# Patient Record
Sex: Female | Born: 1990 | Race: White | Hispanic: No | Marital: Single | Smoking: Never smoker
Health system: Southern US, Community
[De-identification: ages and names within clinical notes are randomized; demographics above are authoritative.]

## PROBLEM LIST (undated history)

## (undated) ENCOUNTER — Inpatient Hospital Stay (HOSPITAL_COMMUNITY): Payer: Self-pay

## (undated) DIAGNOSIS — D649 Anemia, unspecified: Secondary | ICD-10-CM

## (undated) DIAGNOSIS — L0591 Pilonidal cyst without abscess: Secondary | ICD-10-CM

## (undated) DIAGNOSIS — Z5189 Encounter for other specified aftercare: Secondary | ICD-10-CM

## (undated) HISTORY — PX: CYST REMOVAL HAND: SHX6279

## (undated) HISTORY — PX: VAGINAL WOUND CLOSURE / REPAIR: SUR258

## (undated) HISTORY — PX: PILONIDAL CYST EXCISION: SHX744

---
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
## 2016-04-13 ENCOUNTER — Encounter: Payer: Self-pay | Admitting: *Deleted

## 2016-04-13 ENCOUNTER — Ambulatory Visit (INDEPENDENT_AMBULATORY_CARE_PROVIDER_SITE_OTHER): Payer: PRIVATE HEALTH INSURANCE

## 2016-04-13 DIAGNOSIS — Z3201 Encounter for pregnancy test, result positive: Secondary | ICD-10-CM | POA: Diagnosis not present

## 2016-04-13 DIAGNOSIS — Z3200 Encounter for pregnancy test, result unknown: Secondary | ICD-10-CM

## 2016-04-13 LAB — POCT URINE PREGNANCY: Preg Test, Ur: POSITIVE — AB

## 2016-04-13 NOTE — Progress Notes (Signed)
Patient presents for Pregnancy Test, UPT-POSITIVE. LMP 02/29/2016. Patient advised to make an appointment for Prenatal Care.

## 2016-05-10 ENCOUNTER — Encounter (HOSPITAL_COMMUNITY): Payer: Self-pay

## 2016-05-10 ENCOUNTER — Inpatient Hospital Stay (HOSPITAL_COMMUNITY)
Admission: AD | Admit: 2016-05-10 | Discharge: 2016-05-10 | Disposition: A | Discharge status: Home / Self Care | Payer: PRIVATE HEALTH INSURANCE | Source: Ambulatory Visit | Attending: Obstetrics and Gynecology | Admitting: Obstetrics and Gynecology

## 2016-05-10 DIAGNOSIS — J029 Acute pharyngitis, unspecified: Secondary | ICD-10-CM | POA: Diagnosis present

## 2016-05-10 DIAGNOSIS — Z3A10 10 weeks gestation of pregnancy: Secondary | ICD-10-CM | POA: Insufficient documentation

## 2016-05-10 DIAGNOSIS — O34219 Maternal care for unspecified type scar from previous cesarean delivery: Secondary | ICD-10-CM | POA: Diagnosis not present

## 2016-05-10 DIAGNOSIS — B9789 Other viral agents as the cause of diseases classified elsewhere: Secondary | ICD-10-CM

## 2016-05-10 DIAGNOSIS — Z9889 Other specified postprocedural states: Secondary | ICD-10-CM | POA: Insufficient documentation

## 2016-05-10 DIAGNOSIS — O99511 Diseases of the respiratory system complicating pregnancy, first trimester: Secondary | ICD-10-CM | POA: Insufficient documentation

## 2016-05-10 DIAGNOSIS — J069 Acute upper respiratory infection, unspecified: Secondary | ICD-10-CM

## 2016-05-10 LAB — URINALYSIS, ROUTINE W REFLEX MICROSCOPIC
BILIRUBIN URINE: NEGATIVE
Glucose, UA: NEGATIVE mg/dL
Hgb urine dipstick: NEGATIVE
KETONES UR: NEGATIVE mg/dL
Nitrite: NEGATIVE
PH: 5 (ref 5.0–8.0)
Protein, ur: NEGATIVE mg/dL
SPECIFIC GRAVITY, URINE: 1.025 (ref 1.005–1.030)

## 2016-05-10 LAB — INFLUENZA PANEL BY PCR (TYPE A & B)
INFLAPCR: NEGATIVE
INFLBPCR: NEGATIVE

## 2016-05-10 MED ORDER — ACETAMINOPHEN 500 MG PO TABS
1000.0000 mg | ORAL_TABLET | Freq: Once | ORAL | Status: AC
  Administered 2016-05-10: 1000 mg via ORAL
  Filled 2016-05-10: qty 2

## 2016-05-10 MED ORDER — PSEUDOEPHEDRINE HCL 30 MG PO TABS
60.0000 mg | ORAL_TABLET | Freq: Once | ORAL | Status: AC
  Administered 2016-05-10: 60 mg via ORAL
  Filled 2016-05-10: qty 2

## 2016-05-10 NOTE — MAU Note (Signed)
Pt c/o flu-like symptoms. States she is congested and having a hard time breathing because of the congestion. States she feels achy. States her temp at home was 100.4.

## 2016-05-10 NOTE — Discharge Instructions (Signed)

## 2016-05-10 NOTE — MAU Provider Note (Signed)
History     CSN: [id number]  Arrival date and time: 05/10/16 2210   First Provider Initiated Contact with Patient 05/10/16 2249      Chief Complaint  Patient presents with  . URI   Courtney Mckee is a 26 y.o. G2P1001 at [redacted]w[redacted]d who presents today with cough, congestion, sore throat and body aches that started today. She reports a temp of 100.4 at home. She has not taken any medications today.    URI   This is a new problem. The current episode started today. The problem has been unchanged. The maximum temperature recorded prior to her arrival was 100.4 - 100.9 F. The fever has been present for less than 1 day. Associated symptoms include congestion, coughing and a sore throat. Pertinent negatives include no nausea or vomiting. She has tried nothing for the symptoms.   History reviewed. No pertinent past medical history.  Past Surgical History:  Procedure Laterality Date  . CESAREAN SECTION    . CYST REMOVAL HAND Right   . WISDOM TOOTH EXTRACTION  2007    No family history on file.  Social History  Substance Use Topics  . Smoking status: Never Smoker  . Smokeless tobacco: Never Used  . Alcohol use No    Allergies: Not on File  No prescriptions prior to admission.    Review of Systems  Constitutional: Positive for chills and fever.  HENT: Positive for congestion and sore throat.   Respiratory: Positive for cough.   Gastrointestinal: Negative for nausea and vomiting.  Musculoskeletal: Positive for myalgias.   Physical Exam   Blood pressure 120/63, pulse (!) 109, temperature 99.6 F (37.6 C), temperature source Oral, resp. rate 16, last menstrual period 02/29/2016, SpO2 100 %.  Physical Exam  Nursing note and vitals reviewed. Constitutional: She is oriented to person, place, and time. She appears well-developed and well-nourished. No distress.  HENT:  Head: Normocephalic.  Cardiovascular: Normal rate and normal heart sounds.   Respiratory: Effort normal. No  respiratory distress. She has no wheezes. She has no rales.  GI: Soft. There is no tenderness. There is no rebound.  Neurological: She is alert and oriented to person, place, and time.  Skin: Skin is warm and dry.  Psychiatric: She has a normal mood and affect.    Results for orders placed or performed during the hospital encounter of 05/10/16 (from the past 24 hour(s))  Urinalysis, Routine w reflex microscopic     Status: Abnormal   Collection Time: 05/10/16 10:17 PM  Result Value Ref Range   Color, Urine YELLOW YELLOW   APPearance CLEAR CLEAR   Specific Gravity, Urine 1.025 1.005 - 1.030   pH 5.0 5.0 - 8.0   Glucose, UA NEGATIVE NEGATIVE mg/dL   Hgb urine dipstick NEGATIVE NEGATIVE   Bilirubin Urine NEGATIVE NEGATIVE   Ketones, ur NEGATIVE NEGATIVE mg/dL   Protein, ur NEGATIVE NEGATIVE mg/dL   Nitrite NEGATIVE NEGATIVE   Leukocytes, UA TRACE (A) NEGATIVE   RBC / HPF 0-5 0 - 5 RBC/hpf   WBC, UA 0-5 0 - 5 WBC/hpf   Bacteria, UA RARE (A) NONE SEEN   Squamous Epithelial / LPF 0-5 (A) NONE SEEN   Mucous PRESENT   Influenza panel by PCR (type A & B)     Status: None   Collection Time: 05/10/16 10:32 PM  Result Value Ref Range   Influenza A By PCR NEGATIVE NEGATIVE   Influenza B By PCR NEGATIVE NEGATIVE    MAU Course  Procedures  MDM   Assessment and Plan   1. Viral URI   2. [redacted] weeks gestation of pregnancy    DC home Comfort measures reviewed  1st/2nd Trimester precautions  RX: none, list of safe OTC meds given  Return to MAU as needed FU with OB as planned  Follow-up Information    Roe Coombs, CNM Follow up.   Specialty:  Certified Nurse Midwife Contact information: [address] [place] [address] [phone]            Tawnya Crook 05/10/2016, 10:51 PM

## 2016-05-11 LAB — RAPID STREP SCREEN (MED CTR MEBANE ONLY): Streptococcus, Group A Screen (Direct): POSITIVE — AB

## 2016-05-12 ENCOUNTER — Telehealth: Payer: Self-pay | Admitting: Obstetrics and Gynecology

## 2016-05-12 MED ORDER — PENICILLIN V POTASSIUM 500 MG PO TABS: 500.0000 mg | ORAL_TABLET | Freq: Three times a day (TID) | ORAL | 0 refills | Status: AC

## 2016-05-12 NOTE — Telephone Encounter (Signed)
Patient with positive strep screen. Will plan for PCN V  TID x10 days. Await patients call pack as she does not have a pharmacy in the system.

## 2016-05-12 NOTE — Telephone Encounter (Signed)
Patient returned called. Wants prescription sent to [facility] on [address].

## 2016-05-16 DIAGNOSIS — Z3480 Encounter for supervision of other normal pregnancy, unspecified trimester: Secondary | ICD-10-CM | POA: Insufficient documentation

## 2016-05-17 ENCOUNTER — Encounter: Payer: Self-pay | Admitting: Certified Nurse Midwife

## 2016-05-17 ENCOUNTER — Ambulatory Visit (INDEPENDENT_AMBULATORY_CARE_PROVIDER_SITE_OTHER): Payer: Medicaid Other | Admitting: Certified Nurse Midwife

## 2016-05-17 VITALS — BP 116/77 | HR 96 | Ht 61.0 in | Wt 154.4 lb

## 2016-05-17 DIAGNOSIS — Z98891 History of uterine scar from previous surgery: Secondary | ICD-10-CM

## 2016-05-17 DIAGNOSIS — O34219 Maternal care for unspecified type scar from previous cesarean delivery: Secondary | ICD-10-CM

## 2016-05-17 DIAGNOSIS — O219 Vomiting of pregnancy, unspecified: Secondary | ICD-10-CM

## 2016-05-17 DIAGNOSIS — Z3480 Encounter for supervision of other normal pregnancy, unspecified trimester: Secondary | ICD-10-CM

## 2016-05-17 DIAGNOSIS — Z3481 Encounter for supervision of other normal pregnancy, first trimester: Secondary | ICD-10-CM

## 2016-05-17 MED ORDER — PRENATE PIXIE 10-0.6-0.4-200 MG PO CAPS: 1.0000 | ORAL_CAPSULE | Freq: Every day | ORAL | 12 refills | Status: AC

## 2016-05-17 MED ORDER — DOXYLAMINE-PYRIDOXINE 10-10 MG PO TBEC: DELAYED_RELEASE_TABLET | ORAL | 4 refills | Status: DC

## 2016-05-17 NOTE — Progress Notes (Signed)
Subjective:    Courtney Mckee is being seen today for  For her first obstetrical visit.  This is not a planned pregnancy. She is at [redacted]w[redacted]d gestation. Her obstetrical history is significant for history of anemia,  pilonidal cyst, uterine surgery, blood transfusion and cesarean section in 2015 for failure to descent. Patient does admit to some nausea and diagnosed 05/10/16 with strep, taking Veetid. Relationship with FOB: spouse, living together, not present at this visit today. Patient does intend to breast feed. Pregnancy history fully reviewed.  The information documented in the HPI was reviewed and verified.  Menstrual History: OB History    Gravida Para Term Preterm AB Living   SAB TAB Ectopic Multiple Live Births           1      Menarche age: 59 Patient's last menstrual period was 02/29/2016.    History reviewed. No pertinent past medical history.  Past Surgical History:  Procedure Laterality Date  . CESAREAN SECTION    . CYST REMOVAL HAND Right   . WISDOM TOOTH EXTRACTION  2007     (Not in a hospital admission) Allergies  Allergen Reactions  . Sulfa Antibiotics     Social History  Substance Use Topics  . Smoking status: Never Smoker  . Smokeless tobacco: Never Used  . Alcohol use No    Family History  Problem Relation Age of Onset  . Mental illness Mother   . Cancer Maternal Grandmother   . Depression Maternal Grandmother      Review of Systems Constitutional: negative for weight loss Gastrointestinal: negative for vomiting Genitourinary:negative for genital lesions and vaginal discharge and dysuria Musculoskeletal:negative for back pain Behavioral/Psych: negative for abusive relationship, depression, illegal drug usage and tobacco use    Objective:    BP 116/77   Pulse 96   Ht  (1.549 m)   Wt 154 lb 6.4 oz (70 kg)   LMP 02/29/2016   BMI 29.17 kg/m  General Appearance:    Alert, cooperative, no distress, appears stated age  Head:     Normocephalic, without obvious abnormality, atraumatic  Eyes:    PERRL, conjunctiva/corneas clear, EOM's intact, fundi    benign, both eyes  Ears:    Normal TM's and external ear canals, both ears  Nose:   Nares normal, septum midline, mucosa normal, no drainage    or sinus tenderness  Throat:   Lips, mucosa, and tongue normal; teeth and gums normal  Neck:   Supple, symmetrical, trachea midline, no adenopathy;    thyroid:  no enlargement/tenderness/nodules; no carotid   bruit or JVD  Back:     Symmetric, no curvature, ROM normal, no CVA tenderness  Lungs:     Clear to auscultation bilaterally, respirations unlabored  Chest Wall:    No tenderness or deformity   Heart:    Regular rate and rhythm, S1 and S2 normal, no murmur, rub   or gallop  Breast Exam:    No tenderness, masses, or nipple abnormality  Abdomen:     Soft, non-tender, bowel sounds active all four quadrants,    no masses, no organomegaly  Genitalia:    Normal female without lesion, discharge or tenderness  Extremities:   Extremities normal, atraumatic, no cyanosis or edema  Pulses:   2+ and symmetric all extremities  Skin:   Skin color, texture, turgor normal, no rashes or lesions  Lymph nodes:   Cervical, supraclavicular, and axillary  nodes normal  Neurologic:   CNII-XII intact, normal strength, sensation and reflexes    throughout     Cervix: long, thick, closed and posterior.      Lab Review Urine pregnancy test Labs reviewed yes:22 Radiologic studies reviewed Not available Assessment:    Pregnancy at [redacted]w[redacted]d weeks , FHT 160 by doppler, S=D.   Plan:     Supervision of other normal pregnancy, antepartum -  Plan: Cervicovaginal ancillary only, Cytology - PAP, TSH, Hemoglobinopathy evaluation, Varicella zoster antibody, IgG, Culture, OB Urine, MaterniT21 PLUS Core+SCA, Hemoglobin A1c, Obstetric Panel, Including HIV, Cystic Fibrosis Mutation 97, Prenat-FeAsp-Meth-FA-DHA w/o A (PRENATE PIXIE) 10-0.6-0.4-200 MG  CAPS  Nausea and vomiting during pregnancy prior to [redacted] weeks gestation -   Plan: Doxylamine-Pyridoxine (DICLEGIS) 10-10 MG TBEC  Hx of cesarean section-  Patient had uterine surgery in 2009 and c/s in 2015 at hospital in [place] . Patient does desire TOLAC,pending until records received from outside facility.    Prenatal vitamins.  Counseling provided regarding continued use of seat belts, cessation of alcohol consumption, smoking or use of illicit drugs; infection precautions i.e., influenza/TDAP immunizations, toxoplasmosis,CMV, parvovirus, listeria and varicella; workplace safety, exercise during pregnancy; routine dental care, safe medications, sexual activity, hot tubs, saunas, pools, travel, caffeine use, fish and methlymercury, potential toxins, hair treatments, varicose veins Weight gain recommendations per IOM guidelines reviewed: underweight/BMI< 18.5--> gain 28 - 40 lbs; normal weight/BMI 18.5 - 24.9--> gain 25 - 35 lbs; overweight/BMI 25 - 29.9--> gain 15 - 25 lbs; obese/BMI >30->gain  11 - 20 lbs Problem list reviewed and updated. FIRST/CF mutation testing/NIPT/QUAD SCREEN/fragile X/Ashkenazi Jewish population testing/Spinal muscular atrophy discussed: ordered. Role of ultrasound in pregnancy discussed; fetal survey: Will schedule 18 wk anatomy scan.. Amniocentesis discussed: not indicated. VBAC calculator score: VBAC consent form provided Meds ordered this encounter  Medications  . DISCONTD: Prenatal MV-Min-FA-Omega-3 (PRENATAL GUMMIES/DHA & FA) 0.4-32.5 MG CHEW    Sig: Chew by mouth.  . Doxylamine-Pyridoxine (DICLEGIS) 10-10 MG TBEC    Sig: Take 1 tablet with breakfast and lunch.  Take 2 tablets at bedtime.    Dispense:  100 tablet    Refill:  4  . Prenat-FeAsp-Meth-FA-DHA w/o A (PRENATE PIXIE) 10-0.6-0.4-200 MG CAPS    Sig: Take 1 tablet by mouth daily.    Dispense:  30 capsule    Refill:  12    Please process coupon: Rx BIN: [id number], RxPCN: [id number], RxGRP: [id number],  RxID: [id number]  SUF: 01   Orders Placed This Encounter  Procedures  . Culture, OB Urine  . TSH  . Hemoglobinopathy evaluation  . Varicella zoster antibody, IgG  . MaterniT21 PLUS Core+SCA    Order Specific Question:   Is the patient insulin dependent?    Answer:   No    Order Specific Question:   Please enter gestational age. This should be expressed as weeks AND days, i.e. 16w 6d. Enter weeks here. Enter days in next question.    Answer:   54    Order Specific Question:   Please enter gestational age. This should be expressed as weeks AND days, i.e. 16w 6d. Enter days here. Enter weeks in previous question.    Answer:   1    Order Specific Question:   How was gestational age calculated?    Answer:   LMP    Order Specific Question:   Please give the date of LMP OR Ultrasound OR Estimated date of delivery.    Answer:   12/05/2016  Order Specific Question:   Number of Fetuses (Type of Pregnancy):    Answer:   1    Order Specific Question:   Indications for performing the test? (please choose all that apply):    Answer:   Routine screening    Order Specific Question:   Other Indications? (Y=Yes, N=No)    Answer:   N    Order Specific Question:   If this is a repeat specimen, please indicate the reason:    Answer:   Not indicated    Order Specific Question:   Please specify the patient's race: (C=White/Caucasion, B=Black, I=Native American, A=Asian, H=Hispanic, O=Other, U=Unknown)    Answer:   B    Order Specific Question:   Donor Egg - indicate if the egg was obtained from in vitro fertilization.    Answer:   N    Order Specific Question:   Age of Egg Donor.    Answer:   62    Order Specific Question:   Prior Down Syndrome/ONTD screening during current pregnancy.    Answer:   N    Order Specific Question:   Prior First Trimester Testing    Answer:   N    Order Specific Question:   Prior Second Trimester Testing    Answer:   N    Order Specific Question:   Family History of  Neural Tube Defects    Answer:   N    Order Specific Question:   Prior Pregnancy with Down Syndrome    Answer:   N    Order Specific Question:   Please give the patient's weight (in pounds)    Answer:   154  . Hemoglobin A1c  . Obstetric Panel, Including HIV  . Cystic Fibrosis Mutation 97    Follow up in 4 weeks. 50% of 30 min visit spent on counseling and coordination of care.

## 2016-05-17 NOTE — Progress Notes (Signed)
Patient has no concerns today- she does have a history of possible uterine surgery we will need records from.

## 2016-05-18 LAB — CERVICOVAGINAL ANCILLARY ONLY
BACTERIAL VAGINITIS: NEGATIVE
CANDIDA VAGINITIS: NEGATIVE
Chlamydia: NEGATIVE
Neisseria Gonorrhea: NEGATIVE
Trichomonas: NEGATIVE

## 2016-05-19 LAB — URINE CULTURE, OB REFLEX

## 2016-05-19 LAB — CULTURE, OB URINE

## 2016-05-19 LAB — CYTOLOGY - PAP: DIAGNOSIS: NEGATIVE

## 2016-05-23 LAB — OBSTETRIC PANEL, INCLUDING HIV
ANTIBODY SCREEN: NEGATIVE
BASOS ABS: 0 10*3/uL (ref 0.0–0.2)
Basos: 0 %
EOS (ABSOLUTE): 0.1 10*3/uL (ref 0.0–0.4)
Eos: 1 %
HIV Screen 4th Generation wRfx: NONREACTIVE
Hematocrit: 42.5 % (ref 34.0–46.6)
Hemoglobin: 13.5 g/dL (ref 11.1–15.9)
Hepatitis B Surface Ag: NEGATIVE
IMMATURE GRANS (ABS): 0 10*3/uL (ref 0.0–0.1)
IMMATURE GRANULOCYTES: 0 %
LYMPHS: 22 %
Lymphocytes Absolute: 1.8 10*3/uL (ref 0.7–3.1)
MCH: 29 pg (ref 26.6–33.0)
MCHC: 31.8 g/dL (ref 31.5–35.7)
MCV: 91 fL (ref 79–97)
MONOCYTES: 6 %
MONOS ABS: 0.5 10*3/uL (ref 0.1–0.9)
NEUTROS PCT: 71 %
Neutrophils Absolute: 5.9 10*3/uL (ref 1.4–7.0)
PLATELETS: 201 10*3/uL (ref 150–379)
RBC: 4.65 x10E6/uL (ref 3.77–5.28)
RDW: 13.8 % (ref 12.3–15.4)
RPR Ser Ql: NONREACTIVE
RUBELLA: 1.1 {index} (ref >0.99)
Rh Factor: POSITIVE
WBC: 8.3 10*3/uL (ref 3.4–10.8)

## 2016-05-23 LAB — MATERNIT21 PLUS CORE+SCA
CHROMOSOME 13: NEGATIVE
CHROMOSOME 18: NEGATIVE
Chromosome 21: NEGATIVE
Y Chromosome: DETECTED

## 2016-05-23 LAB — HEMOGLOBINOPATHY EVALUATION
HEMOGLOBIN A2 QUANTITATION: 2.5 % (ref 1.8–3.2)
HGB A: 97 % (ref 96.4–98.8)
HGB C: 0 %
HGB S: 0 %
HGB VARIANT: 0 %
Hemoglobin F Quantitation: 0.5 % (ref 0.0–2.0)

## 2016-05-23 LAB — CYSTIC FIBROSIS MUTATION 97: GENE DIS ANAL CARRIER INTERP BLD/T-IMP: NOT DETECTED

## 2016-05-23 LAB — VARICELLA ZOSTER ANTIBODY, IGG: Varicella zoster IgG: 356 index (ref >165)

## 2016-05-23 LAB — TSH: TSH: 1.13 u[IU]/mL (ref 0.450–4.500)

## 2016-05-23 LAB — HEMOGLOBIN A1C
ESTIMATED AVERAGE GLUCOSE: 100 mg/dL
HEMOGLOBIN A1C: 5.1 % (ref 4.8–5.6)

## 2016-05-24 ENCOUNTER — Other Ambulatory Visit: Payer: Self-pay | Admitting: Certified Nurse Midwife

## 2016-05-24 DIAGNOSIS — Z3480 Encounter for supervision of other normal pregnancy, unspecified trimester: Secondary | ICD-10-CM

## 2016-05-25 ENCOUNTER — Encounter: Payer: Self-pay | Admitting: Family Medicine

## 2016-06-14 ENCOUNTER — Ambulatory Visit (INDEPENDENT_AMBULATORY_CARE_PROVIDER_SITE_OTHER): Payer: PRIVATE HEALTH INSURANCE | Admitting: Certified Nurse Midwife

## 2016-06-14 ENCOUNTER — Encounter: Payer: Self-pay | Admitting: Certified Nurse Midwife

## 2016-06-14 VITALS — BP 123/76 | HR 89 | Wt 152.8 lb

## 2016-06-14 DIAGNOSIS — Z98891 History of uterine scar from previous surgery: Secondary | ICD-10-CM

## 2016-06-14 DIAGNOSIS — O34219 Maternal care for unspecified type scar from previous cesarean delivery: Secondary | ICD-10-CM

## 2016-06-14 DIAGNOSIS — Z3480 Encounter for supervision of other normal pregnancy, unspecified trimester: Secondary | ICD-10-CM

## 2016-06-14 DIAGNOSIS — O09299 Supervision of pregnancy with other poor reproductive or obstetric history, unspecified trimester: Secondary | ICD-10-CM | POA: Insufficient documentation

## 2016-06-14 DIAGNOSIS — Z3482 Encounter for supervision of other normal pregnancy, second trimester: Secondary | ICD-10-CM

## 2016-06-14 NOTE — Progress Notes (Signed)
Patient is in the office, no concerns, denies pain.

## 2016-06-14 NOTE — Patient Instructions (Signed)
AREA PEDIATRIC/FAMILY PRACTICE PHYSICIANS  [facility] [address], [address] [place], [place]  [address] Phone - [phone]   Fax - [phone]  [facility] [address] [place], [place] [address] Phone - [phone]   Fax - [phone]  [facility] [address] [place], [place]  [address] Phone - [phone]   Fax - [phone]  [facility] [address] [place], [place]  [address] Phone - [phone]   Fax - [phone]  [facility] [address] [place], [place]  [address] Phone - [phone]   Fax - [phone]  [facility] [address] [place], [place]  [address] Phone - [phone]   Fax - [phone]  [facility] [address] [place], [place]  [address] Phone - [phone]   Fax - [phone]  [facility] [address] [place], [place]  [address] Phone - [phone]   Fax - [phone]  [facility] [address] [place], [place]  [address] Phone - [phone]   Fax - [phone] [facility] [address] [place], [place]  [address] Phone - [phone]   Fax - [phone]  [facility] [address] [place], [place]  [address] Phone - [phone]   Fax - [phone]  [facility] [address] [place], [place]  [address] Phone - [phone]   Fax - [phone]  [facility] [address] [place], [place]  [address] Phone - [phone]   Fax - [phone]  [facility] [address] [place], [place]  [address] Phone - [phone]  [facility] [address] [place], [place]  [address] Phone - [phone]   Fax - [phone]  [facility] [address] [place], [place]  [address] Phone - [phone]   Fax - [phone]  [facility] [address] [place], [place]  [address] Phone - [phone]   Fax - [phone]  [facility] [address] [place], [place]  [address] Phone - [phone]   Fax - [phone]   [facility] [address] [place], [place]  [address] Phone - [phone]   Fax - [phone]  [facility] [address] [place], [place]  [address] Phone - [phone]   Fax - [phone]  [facility] R[facility] [address] [place], [place]  [address] Phone - [phone]   Fax - [phone]  [facility] [address] , [place]  [address] Phone - [phone]   Fax - [phone]  [facility] [address] , [place]  [address] Phone - [phone]   Fax - [phone] [facility] [address] [place], [place]  [address] Phone - [phone]   Fax - [phone]  [facility] [address] [place], [place]  [address] Phone - [phone]   Fax - [phone]  [facility] [address] [place], [place]  [address] Phone - [phone]   Fax - [phone]  [facility] Charlene Flemming MD [address] [place] [address] Phone [phone]  Fax [phone]   

## 2016-06-14 NOTE — Progress Notes (Signed)
   PRENATAL VISIT NOTE  Subjective:  Courtney Mckee is a 26 y.o. G2P1001 at 5710w1d being seen today for ongoing prenatal care.  She is currently monitored for the following issues for this low-risk pregnancy and has Supervision of other normal pregnancy, antepartum; Previous cesarean delivery affecting pregnancy; and History of uterine scar from previous surgery on her problem list.  Patient reports acne on right shoulder that she has been a   Contractions: Not present. Vag. Bleeding: None.  Movement: Present. Denies leaking of fluid.   The following portions of the patient's history were reviewed and updated as appropriate: allergies, current medications, past family history, past medical history, past social history, past surgical history and problem list. Problem list updated.  Objective:   Vitals:   06/14/16 1323  BP: 123/76  Pulse: 89  Weight: 152 lb 12.8 oz (69.3 kg)    Fetal Status: Fetal Heart Rate (bpm): 152   Movement: Present     General:  Alert, oriented and cooperative. Patient is in no acute distress.  Skin: Skin is warm and dry. No rash noted. Small amount of papules on upper right shoulder to back.  Cardiovascular: Normal heart rate noted  Respiratory: Normal respiratory effort, no problems with respiration noted  Abdomen: Soft, gravid, appropriate for gestational age. Pain/Pressure: Absent     Pelvic:  Cervical exam deferred        Extremities: Normal range of motion.  Edema: None  Mental Status: Normal mood and affect. Normal behavior. Normal judgment and thought content.   Assessment and Plan:  Pregnancy: G2P1001 at 6910w1d  1. Supervision of other normal pregnancy, antepartum  Patient doing well,reports some acne to upper right shoulder. - AFP, Serum, Open Spina Bifida - US MFM OB COMP + 14 WK; Future  2. Previous cesarean delivery affecting pregnancy Patient desires TOLAC,signed [facility] with dates of service.  3.  History of uterine scar from previous surgery Patient desires TOLAC,signed [facility] with dates of service.   Preterm labor symptoms and general obstetric precautions including but not limited to vaginal bleeding, contractions, leaking of fluid and fetal movement were reviewed in detail with the patient. Please refer to After Visit Summary for other counseling recommendations.  Return in about 4 weeks (around 07/12/2016) for ROB.   Roe Coombsenney, Rachelle A, CNM

## 2016-06-17 LAB — AFP, SERUM, OPEN SPINA BIFIDA
AFP MOM: 0.89
AFP Value: 24.9 ng/mL
Gest. Age on Collection Date: 15.1 weeks
MATERNAL AGE AT EDD: 25.1 a
OSBR Risk 1 IN: 10000
Test Results:: NEGATIVE
Weight: 152 [lb_av]

## 2016-06-18 ENCOUNTER — Other Ambulatory Visit: Payer: Self-pay | Admitting: Certified Nurse Midwife

## 2016-06-18 DIAGNOSIS — Z3480 Encounter for supervision of other normal pregnancy, unspecified trimester: Secondary | ICD-10-CM

## 2016-07-12 ENCOUNTER — Ambulatory Visit (INDEPENDENT_AMBULATORY_CARE_PROVIDER_SITE_OTHER): Payer: Medicaid Other | Admitting: Certified Nurse Midwife

## 2016-07-12 ENCOUNTER — Ambulatory Visit (HOSPITAL_COMMUNITY)
Admission: RE | Admit: 2016-07-12 | Discharge: 2016-07-12 | Disposition: A | Discharge status: Home / Self Care | Payer: Medicaid Other | Source: Ambulatory Visit | Attending: Certified Nurse Midwife | Admitting: Certified Nurse Midwife

## 2016-07-12 ENCOUNTER — Other Ambulatory Visit: Payer: Self-pay | Admitting: Certified Nurse Midwife

## 2016-07-12 ENCOUNTER — Encounter: Payer: Self-pay | Admitting: Certified Nurse Midwife

## 2016-07-12 VITALS — BP 100/66 | HR 92 | Wt 155.6 lb

## 2016-07-12 DIAGNOSIS — Z3482 Encounter for supervision of other normal pregnancy, second trimester: Secondary | ICD-10-CM

## 2016-07-12 DIAGNOSIS — Z363 Encounter for antenatal screening for malformations: Secondary | ICD-10-CM | POA: Diagnosis not present

## 2016-07-12 DIAGNOSIS — Z3480 Encounter for supervision of other normal pregnancy, unspecified trimester: Secondary | ICD-10-CM

## 2016-07-12 DIAGNOSIS — Z3A19 19 weeks gestation of pregnancy: Secondary | ICD-10-CM

## 2016-07-12 DIAGNOSIS — O34219 Maternal care for unspecified type scar from previous cesarean delivery: Secondary | ICD-10-CM | POA: Insufficient documentation

## 2016-07-12 DIAGNOSIS — Z98891 History of uterine scar from previous surgery: Secondary | ICD-10-CM

## 2016-07-12 NOTE — Progress Notes (Signed)
   PRENATAL VISIT NOTE  Subjective:  Courtney Mckee is a 26 y.o. G2P1001 at 4282w1d being seen today for ongoing prenatal care.  She is currently monitored for the following issues for this low-risk pregnancy and has Supervision of other normal pregnancy, antepartum; Previous cesarean delivery affecting pregnancy; and History of uterine scar from previous surgery on her problem list.  Patient reports no complaints.  Contractions: Not present. Vag. Bleeding: None.  Movement: Present. Denies leaking of fluid.   The following portions of the patient's history were reviewed and updated as appropriate: allergies, current medications, past family history, past medical history, past social history, past surgical history and problem list. Problem list updated.  Objective:   Vitals:   07/12/16 0855  BP: 100/66  Pulse: 92  Weight: 155 lb 9.6 oz (70.6 kg)    Fetal Status: Fetal Heart Rate (bpm): 156   Movement: Present     General:  Alert, oriented and cooperative. Patient is in no acute distress.  Skin: Skin is warm and dry. No rash noted.   Cardiovascular: Normal heart rate noted  Respiratory: Normal respiratory effort, no problems with respiration noted  Abdomen: Soft, gravid, appropriate for gestational age. Pain/Pressure: Absent     Pelvic:  Cervical exam deferred        Extremities: Normal range of motion.  Edema: None  Mental Status: Normal mood and affect. Normal behavior. Normal judgment and thought content.   Assessment and Plan:  Pregnancy: G2P1001 at 10982w1d  1. Supervision of other normal pregnancy, antepartum     Doing well. Has anatomy scan scheduled for today.   2. Previous cesarean delivery affecting pregnancy     Unknown if able to TOLAC  3. History of uterine scar from previous surgery    Previous records requested, not received yet.   Preterm labor symptoms and general obstetric precautions including but not limited to vaginal bleeding, contractions, leaking of fluid and  fetal movement were reviewed in detail with the patient. Please refer to After Visit Summary for other counseling recommendations.  Return in about 4 weeks (around 08/09/2016) for ROB.   Roe Coombsachelle A Veryl Winemiller, CNM

## 2016-07-12 NOTE — Progress Notes (Signed)
Patient reports good fetal movement, denies pain. 

## 2016-07-14 ENCOUNTER — Encounter: Payer: Self-pay | Admitting: Certified Nurse Midwife

## 2016-08-07 ENCOUNTER — Encounter: Payer: Self-pay | Admitting: *Deleted

## 2016-08-07 ENCOUNTER — Ambulatory Visit (INDEPENDENT_AMBULATORY_CARE_PROVIDER_SITE_OTHER): Payer: Medicaid Other | Admitting: Certified Nurse Midwife

## 2016-08-07 VITALS — BP 112/75 | HR 93 | Wt 161.2 lb

## 2016-08-07 DIAGNOSIS — R000 Tachycardia, unspecified: Secondary | ICD-10-CM

## 2016-08-07 DIAGNOSIS — Z3482 Encounter for supervision of other normal pregnancy, second trimester: Secondary | ICD-10-CM

## 2016-08-07 DIAGNOSIS — O09292 Supervision of pregnancy with other poor reproductive or obstetric history, second trimester: Secondary | ICD-10-CM

## 2016-08-07 DIAGNOSIS — O34219 Maternal care for unspecified type scar from previous cesarean delivery: Secondary | ICD-10-CM

## 2016-08-07 DIAGNOSIS — Z3480 Encounter for supervision of other normal pregnancy, unspecified trimester: Secondary | ICD-10-CM

## 2016-08-07 DIAGNOSIS — O09299 Supervision of pregnancy with other poor reproductive or obstetric history, unspecified trimester: Secondary | ICD-10-CM

## 2016-08-07 MED ORDER — CITRANATAL BLOOM 90-1 MG PO TABS: 1.0000 | ORAL_TABLET | Freq: Every day | ORAL | 12 refills | Status: AC

## 2016-08-07 NOTE — Progress Notes (Signed)
Patient states she ad blue lips a couple weeks ago- it has not happened since. Patient reports she is doing well otherwise.

## 2016-08-07 NOTE — Progress Notes (Signed)
   PRENATAL VISIT NOTE  Subjective:  Courtney Mckee is a 26 y.o. G2P1001 at 4344w6d being seen today for ongoing prenatal care.  She is currently monitored for the following issues for this low-risk pregnancy and has Supervision of other normal pregnancy, antepartum; Previous cesarean delivery affecting pregnancy; and History of maternal vaginal laceration, currently pregnant on her problem list.  Patient reports no bleeding, no contractions, no cramping, no leaking and episode of SOB with "blue tinged lips" reported by her sister, no syncope reported, no hx of palpitations or feeling like her heart is racing.  Does have a hx of syncope episode as a teenager with blood loss. .  Contractions: Not present. Vag. Bleeding: None.  Movement: Present. Denies leaking of fluid.   The following portions of the patient's history were reviewed and updated as appropriate: allergies, current medications, past family history, past medical history, past social history, past surgical history and problem list. Problem list updated.  Objective:   Vitals:   08/07/16 0839  BP: 112/75  Pulse: 93  Weight: 161 lb 3.2 oz (73.1 kg)    Fetal Status: Fetal Heart Rate (bpm): 142 Fundal Height: 22 cm Movement: Present     General:  Alert, oriented and cooperative. Patient is in no acute distress.  Skin: Skin is warm and dry. No rash noted.   Cardiovascular: Normal heart rate noted  Respiratory: Normal respiratory effort, no problems with respiration noted  Abdomen: Soft, gravid, appropriate for gestational age. Pain/Pressure: Absent     Pelvic:  Cervical exam deferred        Extremities: Normal range of motion.  Edema: None  Mental Status: Normal mood and affect. Normal behavior. Normal judgment and thought content.   Assessment and Plan:  Pregnancy: G2P1001 at 6944w6d  1. Supervision of other normal pregnancy, antepartum       - Prenatal-DSS-FeCb-FeGl-FA (CITRANATAL BLOOM) 90-1 MG TABS; Take 1 tablet by mouth daily.   Dispense: 30 tablet; Refill: 12  2. Previous cesarean delivery affecting pregnancy     TOLAC completed  3. History of maternal vaginal laceration, currently pregnant    With blood transfusion as a teenager  4. Tachycardia      With prior hx, never had a cardiology work up.  - Ambulatory referral to Cardiology  Preterm labor symptoms and general obstetric precautions including but not limited to vaginal bleeding, contractions, leaking of fluid and fetal movement were reviewed in detail with the patient. Please refer to After Visit Summary for other counseling recommendations.  Return in about 4 weeks (around 09/04/2016) for ROB, 2 hr OGTT.   Roe Coombsachelle A Yosiel Thieme, CNM

## 2016-08-11 ENCOUNTER — Ambulatory Visit (HOSPITAL_COMMUNITY)
Admission: RE | Admit: 2016-08-11 | Discharge: 2016-08-11 | Disposition: A | Discharge status: Home / Self Care | Payer: Medicaid Other | Source: Ambulatory Visit | Attending: Certified Nurse Midwife | Admitting: Certified Nurse Midwife

## 2016-08-11 ENCOUNTER — Other Ambulatory Visit: Payer: Self-pay | Admitting: Certified Nurse Midwife

## 2016-08-11 ENCOUNTER — Encounter (HOSPITAL_COMMUNITY): Payer: Self-pay

## 2016-08-11 DIAGNOSIS — Z3480 Encounter for supervision of other normal pregnancy, unspecified trimester: Secondary | ICD-10-CM

## 2016-08-11 DIAGNOSIS — O34219 Maternal care for unspecified type scar from previous cesarean delivery: Secondary | ICD-10-CM | POA: Insufficient documentation

## 2016-08-11 DIAGNOSIS — Z3A23 23 weeks gestation of pregnancy: Secondary | ICD-10-CM

## 2016-08-11 DIAGNOSIS — Z362 Encounter for other antenatal screening follow-up: Secondary | ICD-10-CM

## 2016-08-11 HISTORY — DX: Anemia, unspecified: D64.9

## 2016-08-11 HISTORY — DX: Pilonidal cyst without abscess: L05.91

## 2016-08-11 HISTORY — DX: Encounter for other specified aftercare: Z51.89

## 2016-08-14 ENCOUNTER — Other Ambulatory Visit: Payer: Self-pay | Admitting: Certified Nurse Midwife

## 2016-08-14 DIAGNOSIS — Z3480 Encounter for supervision of other normal pregnancy, unspecified trimester: Secondary | ICD-10-CM

## 2016-08-29 ENCOUNTER — Ambulatory Visit: Payer: Medicaid Other | Admitting: Internal Medicine

## 2016-09-04 ENCOUNTER — Other Ambulatory Visit: Payer: Medicaid Other | Admitting: Certified Nurse Midwife

## 2016-09-05 ENCOUNTER — Encounter: Payer: Self-pay | Admitting: Certified Nurse Midwife

## 2016-09-05 ENCOUNTER — Ambulatory Visit (INDEPENDENT_AMBULATORY_CARE_PROVIDER_SITE_OTHER): Payer: Medicaid Other | Admitting: Certified Nurse Midwife

## 2016-09-05 ENCOUNTER — Other Ambulatory Visit: Payer: Medicaid Other

## 2016-09-05 ENCOUNTER — Other Ambulatory Visit: Payer: Medicaid Other | Admitting: Certified Nurse Midwife

## 2016-09-05 DIAGNOSIS — Z3480 Encounter for supervision of other normal pregnancy, unspecified trimester: Secondary | ICD-10-CM

## 2016-09-05 DIAGNOSIS — Z3482 Encounter for supervision of other normal pregnancy, second trimester: Secondary | ICD-10-CM

## 2016-09-05 NOTE — Patient Instructions (Signed)
AREA PEDIATRIC/FAMILY PRACTICE PHYSICIANS  [facility] [address], [address] [place], [place]  [address] Phone - [phone]   Fax - [phone]  [facility] [address] [place], [place] [address] Phone - [phone]   Fax - [phone]  JACK AMOS [address] [place], [place]  [address] Phone - [phone]   Fax - [phone]  [facility] [address] [place], [place]  [address] Phone - [phone]   Fax - [phone]  [facility] [address] [place], [place]  [address] Phone - [phone]   Fax - [phone]  [facility] [address], [address] [place], [place]  [address] Phone - [phone]   Fax - [phone]  [facility] [place] [address] [place], [place]  [address] Phone - [phone]   Fax - [phone]  [facility] [address], [address] [place], [place]  [address] Phone - [phone]   Fax - [phone]  [facility] [address] [place], [place]  [address] Phone - [phone]   Fax - [phone] [facility] [address] [place], [place]  [address] Phone - [phone]   Fax - [phone]  [facility] [address] [place], [place]  [address] Phone - [phone]   Fax - [phone]  [facility] [address] [place], [place]  [address] Phone - [phone]   Fax - [phone]  [facility] [address], [address] [place], [place]  [address] Phone - [phone]   Fax - [phone]  SHILPA GOSRANI [address] [place], [place]  [address] Phone - [phone]  [facility] [address] [place], [place]  [address] Phone - [phone]   Fax - [phone]  [facility] [address] [place], [place]  [address] Phone - [phone]   Fax - [phone]  [facility] [address] [place], [place]  [address] Phone - [phone]   Fax - [phone]  [facility] [address] [place], [place]  [address] Phone - [phone]   Fax - [phone]   [facility] [address] [place], [place]  [address] Phone - [phone]   Fax - [phone]  [facility] [address] [place], [place]  [address] Phone - [phone]   Fax - [phone]  DAVID RUBIN [address] [place], [place]  [address] Phone - [phone]   Fax - [phone]  [facility] [address] [place], [place]  [address] Phone - [phone]   Fax - [phone]  [facility] [address] , [place]  [address] Phone - [phone]   Fax - [phone] [facility] [address] [place], [place]  [address] Phone - [phone]   Fax - [phone]  [facility] [address] [place], [place]  [address] Phone - [phone]   Fax - [phone]   [facility] [place] [address] [place],   [address] Phone - [phone]   Fax - [phone]  [facility] [place] Charlene Flemming MD [address] [place]  [address] Phone [phone]  Fax [phone]   

## 2016-09-05 NOTE — Progress Notes (Signed)
   PRENATAL VISIT NOTE  Subjective:  Courtney Mckee is a 26 y.o. G2P1001 at 1167w0d being seen today for ongoing prenatal care.  She is currently monitored for the following issues for this low-risk pregnancy and has Supervision of other normal pregnancy, antepartum; Previous cesarean delivery affecting pregnancy; and History of maternal vaginal laceration, currently pregnant on her problem list.  Patient reports no complaints.  Contractions: Not present. Vag. Bleeding: None.  Movement: Present. Denies leaking of fluid.   The following portions of the patient's history were reviewed and updated as appropriate: allergies, current medications, past family history, past medical history, past social history, past surgical history and problem list. Problem list updated.  Objective:   Vitals:   09/05/16 0906  BP: 111/72  Pulse: 91  Weight: 169 lb 12.8 oz (77 kg)    Fetal Status: Fetal Heart Rate (bpm): 146 Fundal Height: 28 cm Movement: Present     General:  Alert, oriented and cooperative. Patient is in no acute distress.  Skin: Skin is warm and dry. No rash noted.   Cardiovascular: Normal heart rate noted  Respiratory: Normal respiratory effort, no problems with respiration noted  Abdomen: Soft, gravid, appropriate for gestational age.  Pain/Pressure: Absent     Pelvic: Cervical exam deferred        Extremities: Normal range of motion.  Edema: Trace  Mental Status:  Normal mood and affect. Normal behavior. Normal judgment and thought content.   Assessment and Plan:  Pregnancy: G2P1001 at 6667w0d  1. Supervision of other normal pregnancy, antepartum     Doing well.  - Glucose Tolerance, 2 Hours w/1 Hour - CBC - HIV antibody (with reflex) - RPR  Preterm labor symptoms and general obstetric precautions including but not limited to vaginal bleeding, contractions, leaking of fluid and fetal movement were reviewed in detail with the patient. Please refer to After Visit Summary for other  counseling recommendations.  Return in about 2 weeks (around 09/19/2016) for ROB.   Roe Coombsachelle A Denney, CNM

## 2016-09-05 NOTE — Progress Notes (Signed)
Patient declines TDAP, she prefers to wait until after delivery

## 2016-09-06 ENCOUNTER — Other Ambulatory Visit: Payer: Self-pay | Admitting: Certified Nurse Midwife

## 2016-09-06 DIAGNOSIS — Z3480 Encounter for supervision of other normal pregnancy, unspecified trimester: Secondary | ICD-10-CM

## 2016-09-06 LAB — RPR: RPR Ser Ql: NONREACTIVE

## 2016-09-06 LAB — GLUCOSE TOLERANCE, 2 HOURS W/ 1HR
GLUCOSE, 1 HOUR: 154 mg/dL (ref 65–179)
GLUCOSE, FASTING: 83 mg/dL (ref 65–91)
Glucose, 2 hour: 124 mg/dL (ref 65–152)

## 2016-09-06 LAB — HIV ANTIBODY (ROUTINE TESTING W REFLEX): HIV SCREEN 4TH GENERATION: NONREACTIVE

## 2016-09-06 LAB — CBC
Hematocrit: 39.4 % (ref 34.0–46.6)
Hemoglobin: 12.7 g/dL (ref 11.1–15.9)
MCH: 31.7 pg (ref 26.6–33.0)
MCHC: 32.2 g/dL (ref 31.5–35.7)
MCV: 98 fL — AB (ref 79–97)
PLATELETS: 172 10*3/uL (ref 150–379)
RBC: 4.01 x10E6/uL (ref 3.77–5.28)
RDW: 15 % (ref 12.3–15.4)
WBC: 9 10*3/uL (ref 3.4–10.8)

## 2016-09-19 ENCOUNTER — Ambulatory Visit (INDEPENDENT_AMBULATORY_CARE_PROVIDER_SITE_OTHER): Payer: Medicaid Other | Admitting: Certified Nurse Midwife

## 2016-09-19 ENCOUNTER — Encounter: Payer: Self-pay | Admitting: Certified Nurse Midwife

## 2016-09-19 VITALS — BP 116/72 | HR 107 | Wt 168.7 lb

## 2016-09-19 DIAGNOSIS — O34219 Maternal care for unspecified type scar from previous cesarean delivery: Secondary | ICD-10-CM

## 2016-09-19 DIAGNOSIS — Z3480 Encounter for supervision of other normal pregnancy, unspecified trimester: Secondary | ICD-10-CM

## 2016-09-19 DIAGNOSIS — Z3483 Encounter for supervision of other normal pregnancy, third trimester: Secondary | ICD-10-CM

## 2016-09-19 NOTE — Progress Notes (Signed)
   PRENATAL VISIT NOTE  Subjective:  Courtney Mckee is a 26 y.o. G2P1001 at 38108w0d being seen today for ongoing prenatal care.  She is currently monitored for the following issues for this low-risk pregnancy and has Supervision of other normal pregnancy, antepartum; Previous cesarean delivery affecting pregnancy; and History of maternal vaginal laceration, currently pregnant on her problem list.  Patient reports no complaints.  Contractions: Not present. Vag. Bleeding: None.  Movement: Present. Denies leaking of fluid.   The following portions of the patient's history were reviewed and updated as appropriate: allergies, current medications, past family history, past medical history, past social history, past surgical history and problem list. Problem list updated.  Objective:   Vitals:   09/19/16 1127  BP: 116/72  Pulse: (!) 107  Weight: 168 lb 11.2 oz (76.5 kg)    Fetal Status: Fetal Heart Rate (bpm): 140 Fundal Height: 30 cm Movement: Present     General:  Alert, oriented and cooperative. Patient is in no acute distress.  Skin: Skin is warm and dry. No rash noted.   Cardiovascular: Normal heart rate noted  Respiratory: Normal respiratory effort, no problems with respiration noted  Abdomen: Soft, gravid, appropriate for gestational age.  Pain/Pressure: Absent     Pelvic: Cervical exam deferred        Extremities: Normal range of motion.  Edema: Trace  Mental Status:  Normal mood and affect. Normal behavior. Normal judgment and thought content.   Assessment and Plan:  Pregnancy: G2P1001 at 30108w0d  1. Supervision of other normal pregnancy, antepartum     Doing well.    2. Previous cesarean delivery affecting pregnancy     TOLAC  Preterm labor symptoms and general obstetric precautions including but not limited to vaginal bleeding, contractions, leaking of fluid and fetal movement were reviewed in detail with the patient. Please refer to After Visit Summary for other counseling  recommendations.  Return in about 2 weeks (around 10/03/2016) for ROB.   Roe Coombsachelle A Janayah Zavada, CNM

## 2016-09-19 NOTE — Progress Notes (Signed)
Patient reports good fetal movement, denies pain. 

## 2016-09-27 ENCOUNTER — Ambulatory Visit: Payer: Medicaid Other | Admitting: Internal Medicine

## 2016-10-03 ENCOUNTER — Encounter: Payer: Self-pay | Admitting: Certified Nurse Midwife

## 2016-10-03 ENCOUNTER — Ambulatory Visit (INDEPENDENT_AMBULATORY_CARE_PROVIDER_SITE_OTHER): Payer: Medicaid Other | Admitting: Certified Nurse Midwife

## 2016-10-03 VITALS — BP 111/71 | HR 89 | Wt 172.5 lb

## 2016-10-03 DIAGNOSIS — O09299 Supervision of pregnancy with other poor reproductive or obstetric history, unspecified trimester: Secondary | ICD-10-CM

## 2016-10-03 DIAGNOSIS — O34219 Maternal care for unspecified type scar from previous cesarean delivery: Secondary | ICD-10-CM

## 2016-10-03 DIAGNOSIS — R238 Other skin changes: Secondary | ICD-10-CM

## 2016-10-03 DIAGNOSIS — O2686 Pruritic urticarial papules and plaques of pregnancy (PUPPP): Secondary | ICD-10-CM

## 2016-10-03 DIAGNOSIS — Z3480 Encounter for supervision of other normal pregnancy, unspecified trimester: Secondary | ICD-10-CM

## 2016-10-03 MED ORDER — HYDROCORTISONE 2.5 % EX OINT: TOPICAL_OINTMENT | Freq: Two times a day (BID) | CUTANEOUS | 0 refills | Status: DC

## 2016-10-03 NOTE — Progress Notes (Signed)
   PRENATAL VISIT NOTE  Subjective:  Courtney Mckee is a 26 y.o. G2P1001 at 48w0dbeing seen today for ongoing prenatal care.  She is currently monitored for the following issues for this low-risk pregnancy and has Supervision of other normal pregnancy, antepartum; Previous cesarean delivery affecting pregnancy; and History of maternal vaginal laceration, currently pregnant on her problem list.  Patient reports no bleeding, no contractions, no cramping, no leaking and burning, tingling, itching vesicles on rib cage towards back on right side started a week ago.  Contractions: Not present. Vag. Bleeding: None.  Movement: Present. Denies leaking of fluid.   The following portions of the patient's history were reviewed and updated as appropriate: allergies, current medications, past family history, past medical history, past social history, past surgical history and problem list. Problem list updated.  Objective:   Vitals:   10/03/16 1314  BP: 111/71  Pulse: 89  Weight: 172 lb 8 oz (78.2 kg)    Fetal Status: Fetal Heart Rate (bpm): 145; doppler Fundal Height: 31 cm Movement: Present     General:  Alert, oriented and cooperative. Patient is in no acute distress.  Skin: Skin is warm and dry. No rash noted.   Cardiovascular: Normal heart rate noted  Respiratory: Normal respiratory effort, no problems with respiration noted  Abdomen: Soft, gravid, appropriate for gestational age.  Pain/Pressure: Absent   Right side upper right around liver edge small area of pin point raised vesicles with pus, erythema around boarders.  On back towards  Spine multiple areas of pustules that have scabbed over.   Pelvic: Cervical exam deferred        Extremities: Normal range of motion.  Edema: Trace  Mental Status:  Normal mood and affect. Normal behavior. Normal judgment and thought content.   Assessment and Plan:  Pregnancy: G2P1001 at 345w0d1. Supervision of other normal pregnancy, antepartum       2.  Previous cesarean delivery affecting pregnancy     TOLAC  3. History of maternal vaginal laceration, currently pregnant       4. Vesicles on right rib cage of unknown origin, no hx of varicella.  Is varicella immune.       Most likely herpetic infection. Viral culture obtained.   - Comp Met (CMET) - Bile acids, total - CBC with Differential/Platelet - hydrocortisone 2.5 % ointment; Apply topically 2 (two) times daily.  Dispense: 30 g; Refill: 0  Preterm labor symptoms and general obstetric precautions including but not limited to vaginal bleeding, contractions, leaking of fluid and fetal movement were reviewed in detail with the patient. Please refer to After Visit Summary for other counseling recommendations.  Return in about 2 weeks (around 10/17/2016) for ROB.   RaMorene CrockerCNM

## 2016-10-03 NOTE — Patient Instructions (Signed)
AREA PEDIATRIC/FAMILY PRACTICE PHYSICIANS  [facility] [address], [address] [place], [place]  [address] Phone - [phone]   Fax - [phone]  [facility] [address] [place], [place] [address] Phone - [phone]   Fax - [phone]  JACK AMOS [address] [place], [place]  [address] Phone - [phone]   Fax - [phone]  [facility] [address] [place], [place]  [address] Phone - [phone]   Fax - [phone]  [facility] [address] [place], [place]  [address] Phone - [phone]   Fax - [phone]  [facility] [address] [place], [place]  [address] Phone - [phone]   Fax - [phone]  [facility] [address] [place], [place]  [address] Phone - [phone]   Fax - [phone]  [facility] [address] [place], [place]  [address] Phone - [phone]   Fax - [phone]  [facility] [address] [place], [place]  [address] Phone - [phone]   Fax - [phone] [facility] [address] [place], [place]  [address] Phone - [phone]   Fax - [phone]  [facility] [address] [place], [place]  [address] Phone - [phone]   Fax - [phone]  [facility] [address] [place], [place]  [address] Phone - [phone]   Fax - [phone]  [facility] [address], [address] [place], [place]  [address] Phone - [phone]   Fax - [phone]  SHILPA GOSRANI [address] [place], [place]  [address] Phone - [phone]  [facility] [address] [place], [place]  [address] Phone - [phone]   Fax - [phone]  [facility] [address] [place], [place]  [address] Phone - [phone]   Fax - [phone]  [facility] [address] [place], [place]  [address] Phone - [phone]   Fax - [phone]  [facility] [address] [place], [place]  [address] Phone - [phone]   Fax - [phone]   [facility] [address] [place], [place]  [address] Phone - [phone]   Fax - [phone]  [facility] [address] [place], [place]  [address] Phone - [phone]   Fax - [phone]  DAVID RUBIN [address] [place], [place]  [address] Phone - [phone]   Fax - [phone]  [facility] [address] [place], [place]  [address] Phone - [phone]   Fax - [phone]  [facility] [address] , [place]  [address] Phone - [phone]   Fax - [phone] [facility] [address] [place], [place]  [address] Phone - [phone]   Fax - [phone]  [facility] [address] [place], [place]  [address] Phone - [phone]   Fax - [phone]  [facility] [address] [place],   [address] Phone - [phone]   Fax - [phone]  [facility] Charlene Flemming MD [address] [place]  [address] Phone [phone]  Fax [phone]   

## 2016-10-05 LAB — COMPREHENSIVE METABOLIC PANEL
A/G RATIO: 1.3 (ref 1.2–2.2)
ALK PHOS: 179 IU/L — AB (ref 39–117)
ALT: 15 IU/L (ref 0–32)
AST: 13 IU/L (ref 0–40)
Albumin: 3.5 g/dL (ref 3.5–5.5)
BILIRUBIN TOTAL: 0.3 mg/dL (ref 0.0–1.2)
BUN/Creatinine Ratio: 16 (ref 9–23)
BUN: 8 mg/dL (ref 6–20)
CALCIUM: 8.7 mg/dL (ref 8.7–10.2)
CHLORIDE: 106 mmol/L (ref 96–106)
CO2: 19 mmol/L — ABNORMAL LOW (ref 20–29)
Creatinine, Ser: 0.51 mg/dL — ABNORMAL LOW (ref 0.57–1.00)
GFR calc Af Amer: 156 mL/min/{1.73_m2} (ref >59)
GFR, EST NON AFRICAN AMERICAN: 135 mL/min/{1.73_m2} (ref >59)
GLOBULIN, TOTAL: 2.6 g/dL (ref 1.5–4.5)
Glucose: 104 mg/dL — ABNORMAL HIGH (ref 65–99)
POTASSIUM: 3.6 mmol/L (ref 3.5–5.2)
SODIUM: 142 mmol/L (ref 134–144)
Total Protein: 6.1 g/dL (ref 6.0–8.5)

## 2016-10-05 LAB — CBC WITH DIFFERENTIAL/PLATELET
Basophils Absolute: 0 10*3/uL (ref 0.0–0.2)
Basos: 0 %
EOS (ABSOLUTE): 0.1 10*3/uL (ref 0.0–0.4)
EOS: 2 %
HEMATOCRIT: 36.9 % (ref 34.0–46.6)
Hemoglobin: 12.6 g/dL (ref 11.1–15.9)
Immature Grans (Abs): 0.1 10*3/uL (ref 0.0–0.1)
Immature Granulocytes: 1 %
LYMPHS ABS: 1.3 10*3/uL (ref 0.7–3.1)
Lymphs: 15 %
MCH: 32 pg (ref 26.6–33.0)
MCHC: 34.1 g/dL (ref 31.5–35.7)
MCV: 94 fL (ref 79–97)
MONOS ABS: 0.6 10*3/uL (ref 0.1–0.9)
Monocytes: 7 %
Neutrophils Absolute: 6.2 10*3/uL (ref 1.4–7.0)
Neutrophils: 75 %
Platelets: 157 10*3/uL (ref 150–379)
RBC: 3.94 x10E6/uL (ref 3.77–5.28)
RDW: 14.9 % (ref 12.3–15.4)
WBC: 8.3 10*3/uL (ref 3.4–10.8)

## 2016-10-05 LAB — BILE ACIDS, TOTAL: Bile Acids Total: 12.4 umol/L (ref 4.7–24.5)

## 2016-10-11 LAB — VIRUS CULTURE

## 2016-10-13 ENCOUNTER — Other Ambulatory Visit: Payer: Self-pay | Admitting: Certified Nurse Midwife

## 2016-10-13 DIAGNOSIS — B029 Zoster without complications: Secondary | ICD-10-CM

## 2016-10-13 MED ORDER — VALACYCLOVIR HCL 1 G PO TABS: 1000.0000 mg | ORAL_TABLET | Freq: Two times a day (BID) | ORAL | 3 refills | Status: DC

## 2016-10-17 ENCOUNTER — Encounter: Payer: Self-pay | Admitting: Certified Nurse Midwife

## 2016-10-17 ENCOUNTER — Ambulatory Visit (INDEPENDENT_AMBULATORY_CARE_PROVIDER_SITE_OTHER): Payer: Medicaid Other | Admitting: Certified Nurse Midwife

## 2016-10-17 VITALS — BP 99/67 | HR 99 | Wt 174.2 lb

## 2016-10-17 DIAGNOSIS — Z3483 Encounter for supervision of other normal pregnancy, third trimester: Secondary | ICD-10-CM

## 2016-10-17 DIAGNOSIS — Z3480 Encounter for supervision of other normal pregnancy, unspecified trimester: Secondary | ICD-10-CM

## 2016-10-17 DIAGNOSIS — O34219 Maternal care for unspecified type scar from previous cesarean delivery: Secondary | ICD-10-CM

## 2016-10-17 NOTE — Patient Instructions (Signed)
AREA PEDIATRIC/FAMILY PRACTICE PHYSICIANS  [facility] [address], [address] [place], [place]  [address] Phone - [phone]   Fax - [phone]  [facility] [address] [place], [place] [address] Phone - [phone]   Fax - [phone]  JACK AMOS [address] [place], [place]  [address] Phone - [phone]   Fax - [phone]  [facility] [address] [place], [place]  [address] Phone - [phone]   Fax - [phone]  [facility] [address] [place], [place]  [address] Phone - [phone]   Fax - [phone]  [facility] [address] [place], [place]  [address] Phone - [phone]   Fax - [phone]  [facility] [address] [place], [place]  [address] Phone - [phone]   Fax - [phone]  [facility] [address] [place], [place]  [address] Phone - [phone]   Fax - [phone]  [facility] [address] [place], [place]  [address] Phone - [phone]   Fax - [phone] [facility] [address] [place], [place]  [address] Phone - [phone]   Fax - [phone]  [facility] [address] [place], [place]  [address] Phone - [phone]   Fax - [phone]  [facility] [address] [place], [place]  [address] Phone - [phone]   Fax - [phone]  [facility] [address], [address] [place], [place]  [address] Phone - [phone]   Fax - [phone]  SHILPA GOSRANI [address] [place], [place]  [address] Phone - [phone]  [facility] [address] [place], [place]  [address] Phone - [phone]   Fax - [phone]  [facility] [address] [place], [place]  [address] Phone - [phone]   Fax - [phone]  [facility] [address] [place], [place]  [address] Phone - [phone]   Fax - [phone]   [facility] [address] [place], [place]  [address] Phone - [phone]   Fax - [phone]   [facility] [address] [place], [place]  [address] Phone - [phone]   Fax - [phone]  [facility] [address] [place], [place]  [address] Phone - [phone]   Fax - [phone]  [facility] RUB[facility] [address] [place], [place]  [address] Phone - [phone]   Fax - [phone]  [facility] [address] [place], [place]  [address] Phone - [phone]   Fax - [phone]  [facility] [address] , [place]  [address] Phone - [phone]   Fax - [phone] [facility] [address] [place], [place]  [address] Phone - [phone]   Fax - [phone]  [facility] [address] [place], [place]  [address] Phone - [phone]   Fax - [phone]  [facility] [place] [address] [place],   [address] Phone - [phone]   Fax - [phone]  [facility] [place] Charlene Flemming MD [address] [place]  [address] Phone [phone]  Fax [phone]   

## 2016-10-17 NOTE — Progress Notes (Signed)
   PRENATAL VISIT NOTE  Subjective:  Courtney Mckee is a 26 y.o. G2P1001 at 3980w0d being seen today for ongoing prenatal care.  She is currently monitored for the following issues for this low-risk pregnancy and has Supervision of other normal pregnancy, antepartum; Previous cesarean delivery affecting pregnancy; and History of maternal vaginal laceration, currently pregnant on her problem list.  Patient reports no complaints.  Contractions: Not present. Vag. Bleeding: None.  Movement: Present. Denies leaking of fluid.   The following portions of the patient's history were reviewed and updated as appropriate: allergies, current medications, past family history, past medical history, past social history, past surgical history and problem list. Problem list updated.  Objective:   Vitals:   10/17/16 1443  BP: 99/67  Pulse: 99  Weight: 174 lb 3.2 oz (79 kg)    Fetal Status: Fetal Heart Rate (bpm): 130; doppler Fundal Height: 33 cm Movement: Present     General:  Alert, oriented and cooperative. Patient is in no acute distress.  Skin: Skin is warm and dry. No rash noted.   Cardiovascular: Normal heart rate noted  Respiratory: Normal respiratory effort, no problems with respiration noted  Abdomen: Soft, gravid, appropriate for gestational age.  Pain/Pressure: Present     Pelvic: Cervical exam deferred        Extremities: Normal range of motion.  Edema: Trace  Mental Status:  Normal mood and affect. Normal behavior. Normal judgment and thought content.   Assessment and Plan:  Pregnancy: G2P1001 at 3080w0d  1. Supervision of other normal pregnancy, antepartum     Doing well.   2. Previous cesarean delivery affecting pregnancy     TOLAC planned.   Preterm labor symptoms and general obstetric precautions including but not limited to vaginal bleeding, contractions, leaking of fluid and fetal movement were reviewed in detail with the patient. Please refer to After Visit Summary for other  counseling recommendations.  Return in about 2 weeks (around 10/31/2016) for ROB.   Roe Coombsachelle A Dail Meece, CNM

## 2016-10-31 ENCOUNTER — Other Ambulatory Visit (HOSPITAL_COMMUNITY)
Admission: RE | Admit: 2016-10-31 | Discharge: 2016-10-31 | Disposition: A | Discharge status: Home / Self Care | Payer: Medicaid Other | Source: Ambulatory Visit | Attending: Certified Nurse Midwife | Admitting: Certified Nurse Midwife

## 2016-10-31 ENCOUNTER — Ambulatory Visit (INDEPENDENT_AMBULATORY_CARE_PROVIDER_SITE_OTHER): Payer: Medicaid Other | Admitting: Certified Nurse Midwife

## 2016-10-31 VITALS — BP 107/72 | HR 92 | Wt 175.8 lb

## 2016-10-31 DIAGNOSIS — Z3483 Encounter for supervision of other normal pregnancy, third trimester: Secondary | ICD-10-CM

## 2016-10-31 DIAGNOSIS — Z3480 Encounter for supervision of other normal pregnancy, unspecified trimester: Secondary | ICD-10-CM

## 2016-10-31 NOTE — Progress Notes (Signed)
Patient reports good fetal movement and states she had one contraction on Sunday.

## 2016-10-31 NOTE — Progress Notes (Signed)
   PRENATAL VISIT NOTE  Subjective:  Courtney Mckee is a 26 y.o. G2P1001 at [redacted]w[redacted]d being seen today for ongoing prenatal care.  She is currently monitored for the following issues for this low-risk pregnancy and has Supervision of other normal pregnancy, antepartum; Previous cesarean delivery affecting pregnancy; and History of maternal vaginal laceration, currently pregnant on her problem list.  Patient reports no complaints.  Contractions: Irregular. Vag. Bleeding: None.  Movement: Present. Denies leaking of fluid.   The following portions of the patient's history were reviewed and updated as appropriate: allergies, current medications, past family history, past medical history, past social history, past surgical history and problem list. Problem list updated.  Objective:   Vitals:   10/31/16 1448  BP: 107/72  Pulse: 92  Weight: 175 lb 12.8 oz (79.7 kg)    Fetal Status: Fetal Heart Rate (bpm): 155; doppler Fundal Height: 37 cm Movement: Present  Presentation: Vertex  General:  Alert, oriented and cooperative. Patient is in no acute distress.  Skin: Skin is warm and dry. No rash noted.   Cardiovascular: Normal heart rate noted  Respiratory: Normal respiratory effort, no problems with respiration noted  Abdomen: Soft, gravid, appropriate for gestational age.  Pain/Pressure: Present     Pelvic: Cervical exam performed Dilation: 1 Effacement (%): Thick Station: Ballotable  Extremities: Normal range of motion.  Edema: Trace  Mental Status:  Normal mood and affect. Normal behavior. Normal judgment and thought content.   Assessment and Plan:  Pregnancy: G2P1001 at [redacted]w[redacted]d  1. Supervision of other normal pregnancy, antepartum     Doing well. TOLAC. GBS/cultures.   Preterm labor symptoms and general obstetric precautions including but not limited to vaginal bleeding, contractions, leaking of fluid and fetal movement were reviewed in detail with the patient. Please refer to After Visit Summary  for other counseling recommendations.  Return in about 1 week (around 11/07/2016) for ROB.   Roe Coombs, CNM

## 2016-11-01 LAB — CERVICOVAGINAL ANCILLARY ONLY
Bacterial vaginitis: POSITIVE — AB
Candida vaginitis: NEGATIVE
Chlamydia: NEGATIVE
Neisseria Gonorrhea: NEGATIVE
Trichomonas: NEGATIVE

## 2016-11-02 ENCOUNTER — Other Ambulatory Visit: Payer: Self-pay | Admitting: Certified Nurse Midwife

## 2016-11-02 DIAGNOSIS — N760 Acute vaginitis: Principal | ICD-10-CM

## 2016-11-02 DIAGNOSIS — B951 Streptococcus, group B, as the cause of diseases classified elsewhere: Secondary | ICD-10-CM | POA: Insufficient documentation

## 2016-11-02 DIAGNOSIS — Z3480 Encounter for supervision of other normal pregnancy, unspecified trimester: Secondary | ICD-10-CM

## 2016-11-02 DIAGNOSIS — B9689 Other specified bacterial agents as the cause of diseases classified elsewhere: Secondary | ICD-10-CM

## 2016-11-02 LAB — STREP GP B NAA: Strep Gp B NAA: POSITIVE — AB

## 2016-11-02 MED ORDER — METRONIDAZOLE 500 MG PO TABS: 500.0000 mg | ORAL_TABLET | Freq: Two times a day (BID) | ORAL | 0 refills | Status: DC

## 2016-11-03 ENCOUNTER — Telehealth: Payer: Self-pay

## 2016-11-03 NOTE — Telephone Encounter (Signed)
Left VM message (per DPR) to pick up RX and check MyChart for results.

## 2016-11-03 NOTE — Telephone Encounter (Signed)
-----   Message from Roe Coombs, CNM sent at 11/02/2016  8:23 AM EDT ----- Please let her know about her Positive GBS status and if she has any questions.  Thank you.   Please let her know that she has BV, & yeast.  An Rx for Flagyl has been sent to the pharmacy for the BV.  Thank you.  R.Denney CNM

## 2016-11-08 ENCOUNTER — Ambulatory Visit (INDEPENDENT_AMBULATORY_CARE_PROVIDER_SITE_OTHER): Payer: Medicaid Other | Admitting: Certified Nurse Midwife

## 2016-11-08 ENCOUNTER — Encounter: Payer: Self-pay | Admitting: Certified Nurse Midwife

## 2016-11-08 VITALS — BP 108/74 | HR 106 | Wt 175.0 lb

## 2016-11-08 DIAGNOSIS — B951 Streptococcus, group B, as the cause of diseases classified elsewhere: Secondary | ICD-10-CM

## 2016-11-08 DIAGNOSIS — Z3483 Encounter for supervision of other normal pregnancy, third trimester: Secondary | ICD-10-CM

## 2016-11-08 DIAGNOSIS — O34219 Maternal care for unspecified type scar from previous cesarean delivery: Secondary | ICD-10-CM

## 2016-11-08 DIAGNOSIS — Z3480 Encounter for supervision of other normal pregnancy, unspecified trimester: Secondary | ICD-10-CM

## 2016-11-08 NOTE — Progress Notes (Signed)
   PRENATAL VISIT NOTE  Subjective:  Courtney Mckee is a 26 y.o. G2P1001 at [redacted]w[redacted]d being seen today for ongoing prenatal care.  She is currently monitored for the following issues for this low-risk pregnancy and has Supervision of other normal pregnancy, antepartum; Previous cesarean delivery affecting pregnancy; History of maternal vaginal laceration, currently pregnant; and Positive GBS test on her problem list.  Patient reports no complaints.  Contractions: Irregular. Vag. Bleeding: None.  Movement: Present. Denies leaking of fluid.   The following portions of the patient's history were reviewed and updated as appropriate: allergies, current medications, past family history, past medical history, past social history, past surgical history and problem list. Problem list updated.  Objective:   Vitals:   11/08/16 0932  BP: 108/74  Pulse: (!) 106  Weight: 175 lb (79.4 kg)    Fetal Status: Fetal Heart Rate (bpm): 135; doppler Fundal Height: 37 cm Movement: Present     General:  Alert, oriented and cooperative. Patient is in no acute distress.  Skin: Skin is warm and dry. No rash noted.   Cardiovascular: Normal heart rate noted  Respiratory: Normal respiratory effort, no problems with respiration noted  Abdomen: Soft, gravid, appropriate for gestational age.  Pain/Pressure: Present     Pelvic: Cervical exam deferred        Extremities: Normal range of motion.     Mental Status:  Normal mood and affect. Normal behavior. Normal judgment and thought content.   Assessment and Plan:  Pregnancy: G2P1001 at [redacted]w[redacted]d  1. Supervision of other normal pregnancy, antepartum     Doing well  2. Positive GBS test     PCN for anticipated vaginal delivery.   3. Previous cesarean delivery affecting pregnancy     TOLAC until 12/06/16 when she will be 40.1 weeks.  Message sent to staff to schedule for that date.    Preterm labor symptoms and general obstetric precautions including but not limited to  vaginal bleeding, contractions, leaking of fluid and fetal movement were reviewed in detail with the patient. Please refer to After Visit Summary for other counseling recommendations.  Return in about 1 week (around 11/15/2016) for ROB.   Roe Coombs, CNM

## 2016-11-09 ENCOUNTER — Encounter (HOSPITAL_COMMUNITY): Payer: Self-pay

## 2016-11-15 ENCOUNTER — Ambulatory Visit (INDEPENDENT_AMBULATORY_CARE_PROVIDER_SITE_OTHER): Payer: Medicaid Other | Admitting: Certified Nurse Midwife

## 2016-11-15 VITALS — BP 104/73 | HR 114 | Wt 177.4 lb

## 2016-11-15 DIAGNOSIS — Z3480 Encounter for supervision of other normal pregnancy, unspecified trimester: Secondary | ICD-10-CM

## 2016-11-15 DIAGNOSIS — O34219 Maternal care for unspecified type scar from previous cesarean delivery: Secondary | ICD-10-CM

## 2016-11-15 DIAGNOSIS — Z3483 Encounter for supervision of other normal pregnancy, third trimester: Secondary | ICD-10-CM

## 2016-11-15 DIAGNOSIS — B951 Streptococcus, group B, as the cause of diseases classified elsewhere: Secondary | ICD-10-CM

## 2016-11-15 NOTE — Progress Notes (Signed)
Patient reports good fetal movement, denies pain today. 

## 2016-11-15 NOTE — Progress Notes (Signed)
   PRENATAL VISIT NOTE  Subjective:  Courtney Mckee is a 26 y.o. G2P1001 at [redacted]w[redacted]d being seen today for ongoing prenatal care.  She is currently monitored for the following issues for this low-risk pregnancy and has Supervision of other normal pregnancy, antepartum; Previous cesarean delivery affecting pregnancy; History of maternal vaginal laceration, currently pregnant; and Positive GBS test on her problem list.  Patient reports no complaints.  Contractions: Not present. Vag. Bleeding: None.  Movement: Present. Denies leaking of fluid.   The following portions of the patient's history were reviewed and updated as appropriate: allergies, current medications, past family history, past medical history, past social history, past surgical history and problem list. Problem list updated.  Objective:   Vitals:   11/15/16 0820  BP: 104/73  Pulse: (!) 114  Weight: 177 lb 6.4 oz (80.5 kg)    Fetal Status: Fetal Heart Rate (bpm): 143; doppler Fundal Height: 39 cm Movement: Present     General:  Alert, oriented and cooperative. Patient is in no acute distress.  Skin: Skin is warm and dry. No rash noted.   Cardiovascular: Normal heart rate noted  Respiratory: Normal respiratory effort, no problems with respiration noted  Abdomen: Soft, gravid, appropriate for gestational age.  Pain/Pressure: Absent     Pelvic: Cervical exam deferred        Extremities: Normal range of motion.  Edema: Trace  Mental Status:  Normal mood and affect. Normal behavior. Normal judgment and thought content.   Assessment and Plan:  Pregnancy: G2P1001 at [redacted]w[redacted]d  1. Supervision of other normal pregnancy, antepartum     Doing well.   2. Previous cesarean delivery affecting pregnancy      TOLAC planned.   3. Positive GBS test      PCN for labor/delivery  Term labor symptoms and general obstetric precautions including but not limited to vaginal bleeding, contractions, leaking of fluid and fetal movement were reviewed in  detail with the patient. Please refer to After Visit Summary for other counseling recommendations.  Return in about 1 week (around 11/22/2016) for ROB.   Roe Coombs, CNM

## 2016-11-22 ENCOUNTER — Ambulatory Visit (INDEPENDENT_AMBULATORY_CARE_PROVIDER_SITE_OTHER): Payer: Medicaid Other | Admitting: Certified Nurse Midwife

## 2016-11-22 ENCOUNTER — Encounter: Payer: Self-pay | Admitting: Certified Nurse Midwife

## 2016-11-22 VITALS — BP 110/74 | HR 102 | Wt 176.0 lb

## 2016-11-22 DIAGNOSIS — Z3480 Encounter for supervision of other normal pregnancy, unspecified trimester: Secondary | ICD-10-CM

## 2016-11-22 DIAGNOSIS — Z3483 Encounter for supervision of other normal pregnancy, third trimester: Secondary | ICD-10-CM

## 2016-11-22 DIAGNOSIS — B951 Streptococcus, group B, as the cause of diseases classified elsewhere: Secondary | ICD-10-CM

## 2016-11-22 DIAGNOSIS — O34219 Maternal care for unspecified type scar from previous cesarean delivery: Secondary | ICD-10-CM

## 2016-11-22 NOTE — Progress Notes (Signed)
Pt questions if she needs to continue Valtrex. Cx check today.

## 2016-11-22 NOTE — Progress Notes (Signed)
   PRENATAL VISIT NOTE  Subjective:  Courtney Mckee is a 26 y.o. G2P1001 at 6886w1d being seen today for ongoing prenatal care.  She is currently monitored for the following issues for this low-risk pregnancy and has Supervision of other normal pregnancy, antepartum; Previous cesarean delivery affecting pregnancy; History of maternal vaginal laceration, currently pregnant; and Positive GBS test on her problem list.  Patient reports no complaints.  Contractions: Not present. Vag. Bleeding: None.  Movement: Present. Denies leaking of fluid.   The following portions of the patient's history were reviewed and updated as appropriate: allergies, current medications, past family history, past medical history, past social history, past surgical history and problem list. Problem list updated.  Objective:   Vitals:   11/22/16 0811  BP: 110/74  Pulse: (!) 102  Weight: 176 lb (79.8 kg)    Fetal Status: Fetal Heart Rate (bpm): 148; doppler Fundal Height: 39 cm Movement: Present  Presentation: Vertex  General:  Alert, oriented and cooperative. Patient is in no acute distress.  Skin: Skin is warm and dry. No rash noted.   Cardiovascular: Normal heart rate noted  Respiratory: Normal respiratory effort, no problems with respiration noted  Abdomen: Soft, gravid, appropriate for gestational age.  Pain/Pressure: Absent     Pelvic: Cervical exam performed Dilation: 1 Effacement (%): Thick Station: -3  Extremities: Normal range of motion.  Edema: Trace  Mental Status:  Normal mood and affect. Normal behavior. Normal judgment and thought content.   Assessment and Plan:  Pregnancy: G2P1001 at 1686w1d  1. Supervision of other normal pregnancy, antepartum     Doing well.   2. Previous cesarean delivery affecting pregnancy     TOLAC planned.   3. Positive GBS test     PCN for labor/delivery  Term labor symptoms and general obstetric precautions including but not limited to vaginal bleeding, contractions,  leaking of fluid and fetal movement were reviewed in detail with the patient. Please refer to After Visit Summary for other counseling recommendations.  Return in about 1 week (around 11/29/2016) for ROB, schedule IOL for 41 weeks.   Roe Coombsachelle A Essance Gatti, CNM

## 2016-11-27 ENCOUNTER — Telehealth (HOSPITAL_COMMUNITY): Payer: Self-pay | Admitting: *Deleted

## 2016-11-27 NOTE — Telephone Encounter (Signed)
Preadmission screen  

## 2016-11-28 ENCOUNTER — Telehealth (HOSPITAL_COMMUNITY): Payer: Self-pay | Admitting: *Deleted

## 2016-11-28 NOTE — Telephone Encounter (Signed)
Preadmission screen  

## 2016-11-29 ENCOUNTER — Ambulatory Visit (INDEPENDENT_AMBULATORY_CARE_PROVIDER_SITE_OTHER): Payer: Medicaid Other | Admitting: Certified Nurse Midwife

## 2016-11-29 ENCOUNTER — Encounter: Payer: Self-pay | Admitting: Certified Nurse Midwife

## 2016-11-29 ENCOUNTER — Encounter (HOSPITAL_COMMUNITY): Payer: Self-pay

## 2016-11-29 VITALS — BP 116/79 | HR 98 | Wt 178.0 lb

## 2016-11-29 DIAGNOSIS — Z3483 Encounter for supervision of other normal pregnancy, third trimester: Secondary | ICD-10-CM

## 2016-11-29 DIAGNOSIS — Z3480 Encounter for supervision of other normal pregnancy, unspecified trimester: Secondary | ICD-10-CM

## 2016-11-29 DIAGNOSIS — B951 Streptococcus, group B, as the cause of diseases classified elsewhere: Secondary | ICD-10-CM

## 2016-11-29 DIAGNOSIS — O34219 Maternal care for unspecified type scar from previous cesarean delivery: Secondary | ICD-10-CM

## 2016-11-29 NOTE — Progress Notes (Signed)
   PRENATAL VISIT NOTE  Subjective:  Courtney Mckee is a 26 y.o. G2P1001 at 5782w1d being seen today for ongoing prenatal care.  She is currently monitored for the following issues for this low-risk pregnancy and has Supervision of other normal pregnancy, antepartum; Previous cesarean delivery affecting pregnancy; History of maternal vaginal laceration, currently pregnant; and Positive GBS test on her problem list.  Patient reports no complaints.  Contractions: Irregular. Vag. Bleeding: None.  Movement: Present. Denies leaking of fluid.   The following portions of the patient's history were reviewed and updated as appropriate: allergies, current medications, past family history, past medical history, past social history, past surgical history and problem list. Problem list updated.  Objective:   Vitals:   11/29/16 0851  BP: 116/79  Pulse: 98  Weight: 178 lb (80.7 kg)    Fetal Status: Fetal Heart Rate (bpm): 141; soppler Fundal Height: 39 cm Movement: Present     General:  Alert, oriented and cooperative. Patient is in no acute distress.  Skin: Skin is warm and dry. No rash noted.   Cardiovascular: Normal heart rate noted  Respiratory: Normal respiratory effort, no problems with respiration noted  Abdomen: Soft, gravid, appropriate for gestational age.  Pain/Pressure: Present     Pelvic: Cervical exam deferred        Extremities: Normal range of motion.     Mental Status:  Normal mood and affect. Normal behavior. Normal judgment and thought content.   Assessment and Plan:  Pregnancy: G2P1001 at 7382w1d  1. Supervision of other normal pregnancy, antepartum     Doing well.  2. Previous cesarean delivery affecting pregnancy     C/S scheduled for 12/06/16  3. Positive GBS test     PCN if labor  Preterm labor symptoms and general obstetric precautions including but not limited to vaginal bleeding, contractions, leaking of fluid and fetal movement were reviewed in detail with the  patient. Please refer to After Visit Summary for other counseling recommendations.  Return in about 3 weeks (around 12/20/2016) for incision check, normal 4 week PP visit in 5 weeks.   Roe Coombsachelle A Kari Kerth, CNM

## 2016-12-05 ENCOUNTER — Encounter (HOSPITAL_COMMUNITY)
Admission: RE | Admit: 2016-12-05 | Discharge: 2016-12-05 | Disposition: A | Discharge status: Home / Self Care | Payer: Medicaid Other | Source: Ambulatory Visit | Attending: Obstetrics and Gynecology | Admitting: Obstetrics and Gynecology

## 2016-12-05 LAB — ABO/RH: ABO/RH(D): O POS

## 2016-12-05 LAB — TYPE AND SCREEN
ABO/RH(D): O POS
Antibody Screen: NEGATIVE

## 2016-12-05 LAB — CBC
HCT: 40.2 % (ref 36.0–46.0)
Hemoglobin: 14 g/dL (ref 12.0–15.0)
MCH: 34.6 pg — ABNORMAL HIGH (ref 26.0–34.0)
MCHC: 34.8 g/dL (ref 30.0–36.0)
MCV: 99.3 fL (ref 78.0–100.0)
Platelets: 134 10*3/uL — ABNORMAL LOW (ref 150–400)
RBC: 4.05 MIL/uL (ref 3.87–5.11)
RDW: 14.8 % (ref 11.5–15.5)
WBC: 8.2 10*3/uL (ref 4.0–10.5)

## 2016-12-05 NOTE — Patient Instructions (Signed)
Joslynne Aguinaldo  12/05/2016   Your procedure is scheduled on:  12/06/2016  [facility] through the [facility]tr[facility] of [facility] at 0930 AM.  Pick up the phone at the desk and dial [phone]  Call this number if you have problems the morning of [phone]  Remember:   Do not eat food:After Midnight.  Do not drink clear liquids: After Midnight.  Take these medicines the morning of surgery with A SIP OF WATER: none   Do not wear jewelry, make-up or nail polish.  Do not wear lotions, powders, or perfumes. Do not wear deodorant.  Do not shave 48 hours prior to surgery.  Do not bring valuables to the hospital.  [facility] is not   responsible for any belongings or valuables brought to the hospital.  Contacts, dentures or bridgework may not be worn into surgery.  Leave suitcase in the car. After surgery it may be brought to your room.  For patients admitted to the hospital, checkout time is 11:00 AM the day of              discharge.    N/A   Please read over the following fact sheets that you were given:   Surgical Site Infection Prevention

## 2016-12-05 NOTE — Pre-Procedure Instructions (Signed)
Platelet count reviewed with Dr Arby BarretteHatchett.  No orders received.

## 2016-12-06 ENCOUNTER — Inpatient Hospital Stay (HOSPITAL_COMMUNITY)
Admission: RE | Admit: 2016-12-06 | Discharge: 2016-12-08 | DRG: 787 | Disposition: A | Discharge status: Home / Self Care | Payer: Medicaid Other | Source: Ambulatory Visit | Attending: Obstetrics and Gynecology | Admitting: Obstetrics and Gynecology

## 2016-12-06 ENCOUNTER — Inpatient Hospital Stay (HOSPITAL_COMMUNITY): Payer: Medicaid Other | Admitting: Anesthesiology

## 2016-12-06 ENCOUNTER — Encounter (HOSPITAL_COMMUNITY): Payer: Self-pay | Admitting: *Deleted

## 2016-12-06 ENCOUNTER — Encounter (HOSPITAL_COMMUNITY)
Admission: RE | Disposition: A | Discharge status: Home / Self Care | Payer: Self-pay | Source: Ambulatory Visit | Attending: Obstetrics and Gynecology

## 2016-12-06 DIAGNOSIS — O9912 Other diseases of the blood and blood-forming organs and certain disorders involving the immune mechanism complicating childbirth: Secondary | ICD-10-CM | POA: Diagnosis present

## 2016-12-06 DIAGNOSIS — O34211 Maternal care for low transverse scar from previous cesarean delivery: Principal | ICD-10-CM | POA: Diagnosis present

## 2016-12-06 DIAGNOSIS — D6959 Other secondary thrombocytopenia: Secondary | ICD-10-CM | POA: Diagnosis present

## 2016-12-06 DIAGNOSIS — O99824 Streptococcus B carrier state complicating childbirth: Secondary | ICD-10-CM | POA: Diagnosis present

## 2016-12-06 DIAGNOSIS — Z3A40 40 weeks gestation of pregnancy: Secondary | ICD-10-CM

## 2016-12-06 DIAGNOSIS — Z98891 History of uterine scar from previous surgery: Secondary | ICD-10-CM

## 2016-12-06 LAB — RPR: RPR Ser Ql: NONREACTIVE

## 2016-12-06 SURGERY — Surgical Case
Anesthesia: Spinal

## 2016-12-06 MED ORDER — ACETAMINOPHEN 325 MG PO TABS
650.0000 mg | ORAL_TABLET | ORAL | Status: DC | PRN
  Administered 2016-12-06 – 2016-12-08 (×2): 650 mg via ORAL
  Filled 2016-12-06 (×2): qty 2

## 2016-12-06 MED ORDER — BUPIVACAINE IN DEXTROSE 0.75-8.25 % IT SOLN
INTRATHECAL | Status: DC | PRN
  Administered 2016-12-06: 1.2 mL via INTRATHECAL

## 2016-12-06 MED ORDER — ONDANSETRON HCL 4 MG/2ML IJ SOLN
INTRAMUSCULAR | Status: AC
  Filled 2016-12-06: qty 2

## 2016-12-06 MED ORDER — OXYTOCIN 40 UNITS IN LACTATED RINGERS INFUSION - SIMPLE MED: 2.5000 [IU]/h | INTRAVENOUS | Status: AC

## 2016-12-06 MED ORDER — MIDAZOLAM HCL 2 MG/2ML IJ SOLN: 0.5000 mg | Freq: Once | INTRAMUSCULAR | Status: DC | PRN

## 2016-12-06 MED ORDER — OXYTOCIN 10 UNIT/ML IJ SOLN
INTRAVENOUS | Status: DC | PRN
  Administered 2016-12-06: 40 [IU] via INTRAVENOUS

## 2016-12-06 MED ORDER — MORPHINE SULFATE (PF) 0.5 MG/ML IJ SOLN
INTRAMUSCULAR | Status: AC
  Filled 2016-12-06: qty 10

## 2016-12-06 MED ORDER — ZOLPIDEM TARTRATE 5 MG PO TABS: 5.0000 mg | ORAL_TABLET | Freq: Every evening | ORAL | Status: DC | PRN

## 2016-12-06 MED ORDER — IBUPROFEN 600 MG PO TABS
600.0000 mg | ORAL_TABLET | Freq: Four times a day (QID) | ORAL | Status: DC
  Administered 2016-12-06 – 2016-12-08 (×8): 600 mg via ORAL
  Filled 2016-12-06 (×8): qty 1

## 2016-12-06 MED ORDER — NALBUPHINE HCL 10 MG/ML IJ SOLN
INTRAMUSCULAR | Status: AC
  Administered 2016-12-06: 5 mg via INTRAVENOUS
  Filled 2016-12-06: qty 1

## 2016-12-06 MED ORDER — SIMETHICONE 80 MG PO CHEW
80.0000 mg | CHEWABLE_TABLET | Freq: Three times a day (TID) | ORAL | Status: DC
  Administered 2016-12-07 – 2016-12-08 (×4): 80 mg via ORAL
  Filled 2016-12-06 (×3): qty 1

## 2016-12-06 MED ORDER — MORPHINE SULFATE (PF) 0.5 MG/ML IJ SOLN
INTRAMUSCULAR | Status: DC | PRN
  Administered 2016-12-06: .2 mg via INTRATHECAL

## 2016-12-06 MED ORDER — PHENYLEPHRINE HCL 10 MG/ML IJ SOLN
INTRAMUSCULAR | Status: DC | PRN
  Administered 2016-12-06: 80 ug via INTRAVENOUS

## 2016-12-06 MED ORDER — OXYTOCIN 10 UNIT/ML IJ SOLN
INTRAMUSCULAR | Status: AC
  Filled 2016-12-06: qty 4

## 2016-12-06 MED ORDER — PHENYLEPHRINE 40 MCG/ML (10ML) SYRINGE FOR IV PUSH (FOR BLOOD PRESSURE SUPPORT)
PREFILLED_SYRINGE | INTRAVENOUS | Status: AC
  Filled 2016-12-06: qty 10

## 2016-12-06 MED ORDER — PROMETHAZINE HCL 25 MG/ML IJ SOLN: 6.2500 mg | INTRAMUSCULAR | Status: DC | PRN

## 2016-12-06 MED ORDER — BUPIVACAINE IN DEXTROSE 0.75-8.25 % IT SOLN
INTRATHECAL | Status: AC
  Filled 2016-12-06: qty 2

## 2016-12-06 MED ORDER — SENNOSIDES-DOCUSATE SODIUM 8.6-50 MG PO TABS
2.0000 | ORAL_TABLET | ORAL | Status: DC
  Administered 2016-12-06 – 2016-12-07 (×2): 2 via ORAL
  Filled 2016-12-06 (×2): qty 2

## 2016-12-06 MED ORDER — LACTATED RINGERS IV SOLN
INTRAVENOUS | Status: DC
  Administered 2016-12-06: 18:00:00 via INTRAVENOUS

## 2016-12-06 MED ORDER — MEPERIDINE HCL 25 MG/ML IJ SOLN: 6.2500 mg | INTRAMUSCULAR | Status: DC | PRN

## 2016-12-06 MED ORDER — DEXAMETHASONE SODIUM PHOSPHATE 4 MG/ML IJ SOLN
INTRAMUSCULAR | Status: DC | PRN
  Administered 2016-12-06: 4 mg via INTRAVENOUS

## 2016-12-06 MED ORDER — PRENATAL MULTIVITAMIN CH
1.0000 | ORAL_TABLET | Freq: Every day | ORAL | Status: DC
  Administered 2016-12-07 – 2016-12-08 (×2): 1 via ORAL
  Filled 2016-12-06 (×2): qty 1

## 2016-12-06 MED ORDER — FENTANYL CITRATE (PF) 100 MCG/2ML IJ SOLN
INTRAMUSCULAR | Status: DC | PRN
  Administered 2016-12-06: 10 ug via INTRATHECAL

## 2016-12-06 MED ORDER — FENTANYL CITRATE (PF) 100 MCG/2ML IJ SOLN
INTRAMUSCULAR | Status: AC
  Filled 2016-12-06: qty 2

## 2016-12-06 MED ORDER — DEXAMETHASONE SODIUM PHOSPHATE 4 MG/ML IJ SOLN
INTRAMUSCULAR | Status: AC
  Filled 2016-12-06: qty 1

## 2016-12-06 MED ORDER — SIMETHICONE 80 MG PO CHEW
80.0000 mg | CHEWABLE_TABLET | ORAL | Status: DC
  Administered 2016-12-06 – 2016-12-07 (×2): 80 mg via ORAL
  Filled 2016-12-06 (×2): qty 1

## 2016-12-06 MED ORDER — ONDANSETRON HCL 4 MG/2ML IJ SOLN
INTRAMUSCULAR | Status: DC | PRN
  Administered 2016-12-06: 4 mg via INTRAVENOUS

## 2016-12-06 MED ORDER — TETANUS-DIPHTH-ACELL PERTUSSIS 5-2.5-18.5 LF-MCG/0.5 IM SUSP
0.5000 mL | Freq: Once | INTRAMUSCULAR | Status: AC
Start: 2016-12-07 — End: 2016-12-08
  Administered 2016-12-08: 0.5 mL via INTRAMUSCULAR
  Filled 2016-12-06: qty 0.5

## 2016-12-06 MED ORDER — NALBUPHINE HCL 10 MG/ML IJ SOLN
5.0000 mg | INTRAMUSCULAR | Status: DC | PRN
  Administered 2016-12-06 – 2016-12-07 (×3): 5 mg via INTRAVENOUS
  Filled 2016-12-06 (×2): qty 1

## 2016-12-06 MED ORDER — CEFAZOLIN SODIUM-DEXTROSE 2-4 GM/100ML-% IV SOLN
2.0000 g | INTRAVENOUS | Status: AC
  Administered 2016-12-06: 2 g via INTRAVENOUS
  Filled 2016-12-06: qty 100

## 2016-12-06 MED ORDER — LACTATED RINGERS IV SOLN
INTRAVENOUS | Status: DC
  Administered 2016-12-06 (×3): via INTRAVENOUS

## 2016-12-06 MED ORDER — DIBUCAINE 1 % RE OINT: 1.0000 "application " | TOPICAL_OINTMENT | RECTAL | Status: DC | PRN

## 2016-12-06 MED ORDER — LACTATED RINGERS IV SOLN
INTRAVENOUS | Status: DC | PRN
  Administered 2016-12-06: 12:00:00 via INTRAVENOUS

## 2016-12-06 MED ORDER — WITCH HAZEL-GLYCERIN EX PADS: 1.0000 "application " | MEDICATED_PAD | CUTANEOUS | Status: DC | PRN

## 2016-12-06 MED ORDER — COCONUT OIL OIL
1.0000 "application " | TOPICAL_OIL | Status: DC | PRN
  Administered 2016-12-07: 1 via TOPICAL
  Filled 2016-12-06: qty 120

## 2016-12-06 MED ORDER — SOD CITRATE-CITRIC ACID 500-334 MG/5ML PO SOLN
30.0000 mL | ORAL | Status: AC
  Administered 2016-12-06: 30 mL via ORAL
  Filled 2016-12-06: qty 15

## 2016-12-06 MED ORDER — OXYCODONE HCL 5 MG PO TABS
10.0000 mg | ORAL_TABLET | ORAL | Status: DC | PRN
  Administered 2016-12-07: 10 mg via ORAL
  Filled 2016-12-06: qty 2

## 2016-12-06 MED ORDER — MORPHINE SULFATE (PF) 4 MG/ML IV SOLN: 1.0000 mg | INTRAVENOUS | Status: DC | PRN

## 2016-12-06 MED ORDER — OXYCODONE HCL 5 MG PO TABS: 5.0000 mg | ORAL_TABLET | ORAL | Status: DC | PRN

## 2016-12-06 MED ORDER — SIMETHICONE 80 MG PO CHEW: 80.0000 mg | CHEWABLE_TABLET | ORAL | Status: DC | PRN

## 2016-12-06 MED ORDER — SCOPOLAMINE 1 MG/3DAYS TD PT72
1.0000 | MEDICATED_PATCH | Freq: Once | TRANSDERMAL | Status: DC
  Administered 2016-12-06: 1.5 mg via TRANSDERMAL
  Filled 2016-12-06: qty 1

## 2016-12-06 MED ORDER — DIPHENHYDRAMINE HCL 25 MG PO CAPS: 25.0000 mg | ORAL_CAPSULE | Freq: Four times a day (QID) | ORAL | Status: DC | PRN

## 2016-12-06 MED ORDER — MENTHOL 3 MG MT LOZG: 1.0000 | LOZENGE | OROMUCOSAL | Status: DC | PRN

## 2016-12-06 MED ORDER — PHENYLEPHRINE 8 MG IN D5W 100 ML (0.08MG/ML) PREMIX OPTIME
INJECTION | INTRAVENOUS | Status: DC | PRN
  Administered 2016-12-06: 60 ug/min via INTRAVENOUS

## 2016-12-06 SURGICAL SUPPLY — 32 items
BENZOIN TINCTURE PRP APPL 2/3 (GAUZE/BANDAGES/DRESSINGS) ×3 IMPLANT
CHLORAPREP W/TINT 26ML (MISCELLANEOUS) ×3 IMPLANT
CLAMP CORD UMBIL (MISCELLANEOUS) IMPLANT
CLOSURE WOUND 1/2 X4 (GAUZE/BANDAGES/DRESSINGS) ×1
CLOTH BEACON ORANGE TIMEOUT ST (SAFETY) ×3 IMPLANT
DRSG OPSITE POSTOP 4X10 (GAUZE/BANDAGES/DRESSINGS) ×3 IMPLANT
ELECT REM PT RETURN 9FT ADLT (ELECTROSURGICAL) ×3
ELECTRODE REM PT RTRN 9FT ADLT (ELECTROSURGICAL) ×1 IMPLANT
EXTRACTOR VACUUM M CUP 4 TUBE (SUCTIONS) IMPLANT
EXTRACTOR VACUUM M CUP 4' TUBE (SUCTIONS)
GLOVE BIOGEL PI IND STRL 7.0 (GLOVE) ×2 IMPLANT
GLOVE BIOGEL PI IND STRL 7.5 (GLOVE) ×2 IMPLANT
GLOVE BIOGEL PI INDICATOR 7.0 (GLOVE) ×4
GLOVE BIOGEL PI INDICATOR 7.5 (GLOVE) ×4
GLOVE ECLIPSE 7.5 STRL STRAW (GLOVE) ×3 IMPLANT
GOWN STRL REUS W/TWL LRG LVL3 (GOWN DISPOSABLE) ×9 IMPLANT
KIT ABG SYR 3ML LUER SLIP (SYRINGE) IMPLANT
NEEDLE HYPO 25X5/8 SAFETYGLIDE (NEEDLE) IMPLANT
NS IRRIG 1000ML POUR BTL (IV SOLUTION) ×3 IMPLANT
PACK C SECTION WH (CUSTOM PROCEDURE TRAY) ×3 IMPLANT
PAD ABD 7.5X8 STRL (GAUZE/BANDAGES/DRESSINGS) ×6 IMPLANT
PAD OB MATERNITY 4.3X12.25 (PERSONAL CARE ITEMS) ×3 IMPLANT
PENCIL SMOKE EVAC W/HOLSTER (ELECTROSURGICAL) ×3 IMPLANT
RTRCTR C-SECT PINK 25CM LRG (MISCELLANEOUS) ×3 IMPLANT
STRIP CLOSURE SKIN 1/2X4 (GAUZE/BANDAGES/DRESSINGS) ×2 IMPLANT
SUT VIC AB 0 CT1 36 (SUTURE) ×3 IMPLANT
SUT VIC AB 2-0 CT1 (SUTURE) ×6 IMPLANT
SUT VIC AB 2-0 CT1 27 (SUTURE) ×2
SUT VIC AB 2-0 CT1 TAPERPNT 27 (SUTURE) ×1 IMPLANT
SUT VIC AB 4-0 KS 27 (SUTURE) ×3 IMPLANT
TOWEL OR 17X24 6PK STRL BLUE (TOWEL DISPOSABLE) ×3 IMPLANT
TRAY FOLEY BAG SILVER LF 14FR (SET/KITS/TRAYS/PACK) ×3 IMPLANT

## 2016-12-06 NOTE — Anesthesia Procedure Notes (Signed)
Spinal  Patient location during procedure: OR End time: 12/06/2016 11:42 AM Staffing Anesthesiologist: Jairo BenJACKSON, Adyson Vanburen Performed: anesthesiologist  Preanesthetic Checklist Completed: patient identified, surgical consent, pre-op evaluation, timeout performed, IV checked, risks and benefits discussed and monitors and equipment checked Spinal Block Prep: ChloraPrep and site prepped and draped Patient monitoring: blood pressure, continuous pulse ox, cardiac monitor and heart rate Approach: midline Location: L3-4 Injection technique: single-shot Needle Needle type: Pencan  Needle gauge: 24 G Needle length: 9 cm Additional Notes Pt identified in Operating room.  Monitors applied. Working IV access confirmed. Sterile prep, drape lumbar spine.  1% lido local L 3,4.  #24 ga Pencan into clear CSF L 3,4.  9 mg 0.75% Bupivacaine with dextrose, fentanyl, morphine injected with asp CSF beginning and end of injection.  Patient asymptomatic, VSS, no heme aspirated, tolerated well.  Sandford Craze Kimyah Frein, MD

## 2016-12-06 NOTE — Lactation Note (Signed)
This note was copied from a baby's chart. Lactation Consultation Note  Patient Name: Courtney Mckee Today's Date: 12/06/2016 Reason for consult: Initial assessment;Term;1st time breastfeeding  Visited with Mom of term baby, delivered by C/S 11 hrs ago.   Baby has 4 recorded breastfeedings.  Mom called out for assistance as baby was cueing.  Baby sleeping STS on Mom's chest when LC entered room 5 mins later.  Baby wouldn't latch with assistance.  Hand expression demonstrated, no colostrum expressed.   Mom noted to have flat nipples, and bruising on areola noted.  Mom denies any discomfort with latches.   Asked Mom to call out when baby cues to eat again, so LC can assess and assist with positioning and latching.   Encouraged STS and breastfeeding on cue.  Goal is 8-12 feedings per 24 hrs. Lactation brochure left with Mom.   To call for assistance.   Consult Status Consult Status: Follow-up Date: 12/07/16 Follow-up type: In-patient    Judee ClaraSmith, Promyse Ardito E 12/06/2016, 11:27 PM

## 2016-12-06 NOTE — Op Note (Signed)
Cesarean Section Operative Report  Courtney Mckee  12/06/2016  Indications: Scheduled Proceedure/Maternal Request   Pre-operative Diagnosis: RCS.   Post-operative Diagnosis: Same   Surgeon: Surgeon(s) and Role:    * Stepehn Eckard, Wilfred CurtisNoah Bedford, MD - Primary    Assistants: Webb SilversmithHeather Krietemeyer  Anesthesia: spinal    Estimated Blood Loss: 253 ml  Total IV Fluids: 2400 ml LR  Urine Output:: 150 ml yellow urine  Specimens: none  Findings: Viable infant in cephalic presentation; Apgars pending; weight pending g; arterial cord pH not obtained; clear amniotic fluid; intact placenta with three vessel cord; normal uterus, fallopian tubes and ovaries bilaterally.  Baby condition / location:  Nursery   Complications: no complications  Indications: Courtney Mckee is a 26 y.o. G2P1001 with an IUP 5470w1d presenting for elective repeat cesarean section. Had initially planned for TOLAC but changed her mind and affirmed decision to proceed with rltcs.  The risks, benefits, complications, treatment options, and exected outcomes were discussed with the patient . The patient dwith the proposed plan, giving informed consent. identified as Courtney Mckee and the procedure verified as C-Section Delivery.  Procedure Details:  The patient was taken back to the operative suite where spinal anesthesia was placed.  A time out was held and the above information confirmed.   After induction of anesthesia, the patient was draped and prepped in the usual sterile manner and placed in a dorsal supine position with a leftward tilt. A Pfannenstiel incision was made and carried down through the subcutaneous tissue to the fascia. Fascial incision was made and sharply extended transversely. The fascia was separated from the underlying rectus tissue superiorly and inferiorly. The peritoneum was identified and sharply entered and extended longitudinally. Alexis retractor was placed. A low transverse uterine incision was made and  extended bluntly. Delivered from cephalic presentation was a viable infant with Apgars and weight as above.  After waiting 60 seconds for delayed cord cutting, the umbilical cord was clamped and cut cord blood was obtained for evaluation. Cord ph was not sent. The placenta was removed Intact and appeared normal. The uterine outline, tubes and ovaries appeared normal though a small amount of endometriosis was visualized. The uterine incision was closed with running locked sutures of 2-0Vicryl with an imbricating layer of the same.   Hemostasis was observed. The peritoneum was closed with 2-0 vicryl. The rectus muscles were examined and hemostasis observed. The fascia was then reapproximated with running sutures of 0Vicryl. The skin was closed with 4-0Vicryl.   Instrument, sponge, and needle counts were correct prior the abdominal closure and were correct at the conclusion of the case.     Disposition: PACU - hemodynamically stable.   Maternal Condition: stable       Signed: Cherrie Gauzeoah B WoukMD 12/06/2016 12:45 PM

## 2016-12-06 NOTE — Transfer of Care (Signed)
Immediate Anesthesia Transfer of Care Note  Patient: Courtney Mckee  Procedure(s) Performed: REPEAT CESAREAN SECTION (N/A )  Patient Location: PACU  Anesthesia Type:Spinal  Level of Consciousness: awake, alert  and oriented  Airway & Oxygen Therapy: Patient Spontanous Breathing  Post-op Assessment: Report given to RN and Post -op Vital signs reviewed and stable  Post vital signs: Reviewed and stable  Last Vitals:  Vitals:   12/06/16 0942  BP: 103/73  Pulse: 83  Resp: 20    Last Pain: There were no vitals filed for this visit.       Complications: No apparent anesthesia complications

## 2016-12-06 NOTE — H&P (Signed)
LABOR AND DELIVERY ADMISSION HISTORY AND PHYSICAL NOTE  Courtney Mckee is a 26 y.o. female G2P1001 with IUP at 4173w1d by presenting for elective repeat cesarean section.   She reports positive fetal movement. She denies leakage of fluid or vaginal bleeding.  Prenatal History/Complications:  Past Medical History: Past Medical History:  Diagnosis Date  . Anemia   . Blood transfusion without reported diagnosis    not related to CS  . Pilonidal cyst     Past Surgical History: Past Surgical History:  Procedure Laterality Date  . CESAREAN SECTION    . CYST REMOVAL HAND Right   . PILONIDAL CYST EXCISION    . VAGINAL WOUND CLOSURE / REPAIR     post coidal  . WISDOM TOOTH EXTRACTION  2007    Obstetrical History: OB History    Gravida Para Term Preterm AB Living   2 1 1     1    SAB TAB Ectopic Multiple Live Births           1      Social History: Social History   Social History  . Marital status: Single    Spouse name: N/A  . Number of children: N/A  . Years of education: N/A   Social History Main Topics  . Smoking status: Never Smoker  . Smokeless tobacco: Never Used  . Alcohol use No     Comment: Occas. prior to preg.  . Drug use: No  . Sexual activity: Yes    Birth control/ protection: None   Other Topics Concern  . None   Social History Narrative  . None    Family History: Family History  Problem Relation Age of Onset  . Mental illness Mother   . Cancer Maternal Grandmother   . Depression Maternal Grandmother     Allergies: Allergies  Allergen Reactions  . Sulfa Antibiotics Other (See Comments)    Unknown    Prescriptions Prior to Admission  Medication Sig Dispense Refill Last Dose  . Prenat-FeAsp-Meth-FA-DHA w/o A (PRENATE PIXIE) 10-0.6-0.4-200 MG CAPS Take 1 tablet by mouth daily. 30 capsule 12 12/05/2016 at Unknown time  . Prenatal-DSS-FeCb-FeGl-FA (CITRANATAL BLOOM) 90-1 MG TABS Take 1 tablet by mouth daily. 30 tablet 12 12/05/2016 at Unknown  time     Review of Systems   All systems reviewed and negative except as stated in HPI  Blood pressure 103/73, pulse 83, resp. rate 20, height 5' (1.524 m), weight 176 lb (79.8 kg), last menstrual period 02/29/2016. General appearance: alert, nad Lungs: clear to auscultation bilaterally Heart: regular rate and rhythm Abdomen: soft, non-tender; bowel sounds normal Extremities: No calf swelling or tenderness     Prenatal labs: ABO, Rh: --/--/O POS, O POS (10/30 1030) Antibody: NEG (10/30 1030) Rubella: 1.10 (04/11 1412) RPR: Non Reactive (10/30 1030)  HBsAg: Negative (04/11 1412)  HIV:   neg GBS: Positive (09/25 1552)  1 hr Glucola: neg Genetic screening:  cfdna neg Anatomy US: wnl  Prenatal Transfer Tool  Maternal Diabetes: No Genetic Screening: Normal Maternal Ultrasounds/Referrals: Normal Fetal Ultrasounds or other Referrals:  None Maternal Substance Abuse:  No Significant Maternal Medications:  None Significant Maternal Lab Results: Lab values include: Group B Strep negative  No results found for this or any previous visit (from the past 24 hour(s)).  Patient Active Problem List   Diagnosis Date Noted  . Positive GBS test 11/02/2016  . History of maternal vaginal laceration, currently pregnant 06/14/2016  . Previous cesarean delivery affecting pregnancy 05/17/2016  .  Supervision of other normal pregnancy, antepartum 05/16/2016    Assessment: Courtney Mckee is a 26 y.o. G2P1001 at [redacted]w[redacted]d here for elective repeat cesarean section.   The risks of cesarean section were discussed with the patient including but were not limited to: bleeding which may require transfusion or reoperation; infection which may require antibiotics; injury to bowel, bladder, ureters or other surrounding organs; injury to the fetus; need for additional procedures including hysterectomy in the event of a life-threatening hemorrhage; placental abnormalities wth subsequent pregnancies, incisional  problems, thromboembolic phenomenon and other postoperative/anesthesia complications. The patient concurred with the proposed plan, giving informed written consent for the procedures.  Patient has been NPO since last night she will remain NPO for procedure. Anesthesia and OR aware.  Preoperative prophylactic antibiotics and SCDs ordered on call to the OR. To OR when ready.  # Thrombocytopenia: plts 134, from borderline. No other lab abnormalities, htn, or preE symptoms. Likely gestational thrombocytopenia. Anesthesia aware  #ID:  gbs pos, no indication for ppx #MOF: br #MOC: nexplanon #Circ:  Yes, outpatient  Anette Riedel B Darthy Manganelli 12/06/2016, 11:16 AM

## 2016-12-06 NOTE — Anesthesia Preprocedure Evaluation (Signed)
Anesthesia Evaluation  Patient identified by MRN, date of birth, ID band Patient awake    Reviewed: Allergy & Precautions, NPO status , Patient's Chart, lab work & pertinent test results  History of Anesthesia Complications Negative for: history of anesthetic complications  Airway Mallampati: II  TM Distance: >3 FB Neck ROM: Full    Dental  (+) Dental Advisory Given   Pulmonary neg pulmonary ROS,    breath sounds clear to auscultation       Cardiovascular negative cardio ROS   Rhythm:Regular Rate:Normal     Neuro/Psych negative neurological ROS     GI/Hepatic negative GI ROS, Neg liver ROS,   Endo/Other  negative endocrine ROS  Renal/GU negative Renal ROS     Musculoskeletal   Abdominal   Peds  Hematology negative hematology ROS (+) plt 134k   Anesthesia Other Findings   Reproductive/Obstetrics (+) Pregnancy                             Anesthesia Physical Anesthesia Plan  ASA: II  Anesthesia Plan: Spinal   Post-op Pain Management:    Induction:   PONV Risk Score and Plan: 2 and Ondansetron, Treatment may vary due to age or medical condition and Scopolamine patch - Pre-op  Airway Management Planned: Natural Airway  Additional Equipment:   Intra-op Plan:   Post-operative Plan:   Informed Consent: I have reviewed the patients History and Physical, chart, labs and discussed the procedure including the risks, benefits and alternatives for the proposed anesthesia with the patient or authorized representative who has indicated his/her understanding and acceptance.   Dental advisory given  Plan Discussed with: CRNA and Surgeon  Anesthesia Plan Comments: (Plan routine monitors, SAB)        Anesthesia Quick Evaluation

## 2016-12-06 NOTE — Anesthesia Postprocedure Evaluation (Signed)
Anesthesia Post Note  Patient: Courtney Mckee  Procedure(s) Performed: REPEAT CESAREAN SECTION (N/A )     Patient location during evaluation: PACU Anesthesia Type: Spinal Level of consciousness: awake and alert, patient cooperative and oriented Pain management: pain level controlled Vital Signs Assessment: post-procedure vital signs reviewed and stable Respiratory status: spontaneous breathing, nonlabored ventilation and respiratory function stable Cardiovascular status: blood pressure returned to baseline and stable Postop Assessment: spinal receding and patient able to bend at knees Anesthetic complications: no    Last Vitals:  Vitals:   12/06/16 1410 12/06/16 1500  BP: 107/69 117/71  Pulse: 67 74  Resp: 18 16  Temp:  36.8 C  SpO2: 94% 98%    Last Pain: There were no vitals filed for this visit. Pain Goal:                 Nikole Swartzentruber,E. Nashalie Sallis

## 2016-12-07 ENCOUNTER — Encounter (HOSPITAL_COMMUNITY): Payer: Self-pay | Admitting: *Deleted

## 2016-12-07 LAB — CBC
HEMATOCRIT: 32.4 % — AB (ref 36.0–46.0)
HEMOGLOBIN: 11.7 g/dL — AB (ref 12.0–15.0)
MCH: 35.7 pg — AB (ref 26.0–34.0)
MCHC: 36.1 g/dL — AB (ref 30.0–36.0)
MCV: 98.8 fL (ref 78.0–100.0)
Platelets: 121 10*3/uL — ABNORMAL LOW (ref 150–400)
RBC: 3.28 MIL/uL — ABNORMAL LOW (ref 3.87–5.11)
RDW: 14.6 % (ref 11.5–15.5)
WBC: 11.5 10*3/uL — ABNORMAL HIGH (ref 4.0–10.5)

## 2016-12-07 NOTE — Lactation Note (Signed)
This note was copied from a baby's chart. Lactation Consultation Note: Assist mother with relatching infant on the left breast. Observed that infant is pinching mothers nipple. Mothers nipples are semi-flat. Mother taught to firm nipple and taught to latch with an off sided latch. Infant latched with better depth. Instruct mother to flange infants lips for wider gape. Mother assist with positioning with good support. Infant sustained latch for 20 mins. Observed infant with a few sucks and swallows. Assist mother with hand expression. Assist mother with positioning infant in football hold. Infant latched for 10 mins. . Mother was given comfort gels and shells. Staff nurse to give hand pump. Mother has an electric pump at home. Mother advised to continue to breastfeed on cue and at least 8-12 times in 24 hours. Mother informed of expected cluster feeding. Mother to page for assistance from staff as needed.   Patient Name: Courtney Mckee ZOXWR'UToday's Date: 12/07/2016 Reason for consult: Follow-up assessment   Maternal Data    Feeding Feeding Type: Breast Fed Length of feed: 10 min  LATCH Score Latch: Repeated attempts needed to sustain latch, nipple held in mouth throughout feeding, stimulation needed to elicit sucking reflex.  Audible Swallowing: A few with stimulation  Type of Nipple: Flat (nipples semi flat, shells given)  Comfort (Breast/Nipple): Filling, red/small blisters or bruises, mild/mod discomfort (comfort gels given, nipples tender and pink)  Hold (Positioning): Assistance needed to correctly position infant at breast and maintain latch.  LATCH Score: 5  Interventions    Lactation Tools Discussed/Used     Consult Status Consult Status: Follow-up Date: 12/08/16 Follow-up type: In-patient    Stevan BornKendrick, Courtney [facility]kee [facility] 12/07/2016, 3:21 PM

## 2016-12-07 NOTE — Addendum Note (Signed)
Addendum  created 12/07/16 0804 by Earmon PhoenixWilkerson, Hazelgrace Bonham P, CRNA   Sign clinical note

## 2016-12-07 NOTE — Progress Notes (Addendum)
Labor Progress Note Courtney Mckee is a 26 y.o. G2P2001 at 5833w1d admitted for elective repeat cesarean section. Had successful RLTCS yesterday.   S: Pt is resting comfortably in bed. She reports passing gas but has not had bowel movement. Bleeding has been less, but has pass multiple small clots. Denies any fluid leakage, calf tenderness, SOB, or abd pain.   O:  BP (!) 99/50 (BP Location: Right Arm)   Pulse 62   Temp 99 F (37.2 C) (Oral)   Resp 18   Ht 5' (1.524 m)   Wt 79.8 kg (176 lb)   LMP 02/29/2016   SpO2 98%   Breastfeeding? Unknown   BMI 34.37 kg/m   CVE:  None. Had some lightheadedness earlier, but states it is gone. BP is 99/50   A&P: 26 y.o. [id number] 4033w1d admitted for elective repeat cesarean section. Pt is POD#1. Surgery was uncomplicated. Pt has pass gas, but no BM. No signs of infection or drainage on incision site. No calf tenderness or lower extremity edema on exam. Pt will be staying for an additional day. Most recent BP is 99/50 and plt count is 134 so monitor closely. #Pain: No reported pain #GBS positive Hx of thrombocytopenia  Steffanie RainwaterProsper M Amponsah, Medical Student 7:16 AM   CNM attestation Post Partum Day #1 I have seen and examined this patient and agree with above documentation in the med student's note.   [name] is a 26 y.o. G2P2001 s/p rLTCS.  Pt denies problems with ambulating, voiding or po intake. Pain is well controlled.  Plan for birth control is Nexplanon.  Method of Feeding: breast  PE:  BP (!) 99/50 (BP Location: Right Arm)   Pulse 62   Temp 99 F (37.2 C) (Oral)   Resp 18   Ht 5' (1.524 m)   Wt 79.8 kg (176 lb)   LMP 02/29/2016   SpO2 98%   Breastfeeding? Unknown   BMI 34.37 kg/m  Fundus firm Incision: intact and dry  CBC    Component Value Date/Time   WBC 11.5 (H) 12/07/2016 0553   RBC 3.28 (L) 12/07/2016 0553   HGB 11.7 (L) 12/07/2016 0553   HGB 12.6 10/03/2016 1610   HCT 32.4 (L) 12/07/2016 0553   HCT 36.9 10/03/2016  1610   PLT 121 (L) 12/07/2016 0553   PLT 157 10/03/2016 1610   MCV 98.8 12/07/2016 0553   MCV 94 10/03/2016 1610   MCH 35.7 (H) 12/07/2016 0553   MCHC 36.1 (H) 12/07/2016 0553   RDW 14.6 12/07/2016 0553   RDW 14.9 10/03/2016 1610   LYMPHSABS 1.3 10/03/2016 1610   EOSABS 0.1 10/03/2016 1610   BASOSABS 0.0 10/03/2016 1610   Plan for discharge: 12/08/16 Hgb and plts stable  SHAW, KIMBERLY, CNM 10:19 AM 12/07/2016

## 2016-12-07 NOTE — Anesthesia Postprocedure Evaluation (Signed)
Anesthesia Post Note  Patient: Courtney Mckee  Procedure(s) Performed: REPEAT CESAREAN SECTION (N/A )     Patient location during evaluation: Mother Baby Anesthesia Type: Spinal Level of consciousness: awake Pain management: pain level controlled Vital Signs Assessment: post-procedure vital signs reviewed and stable Respiratory status: spontaneous breathing Cardiovascular status: stable Postop Assessment: no headache, epidural receding and patient able to bend at knees Anesthetic complications: no    Last Vitals:  Vitals:   12/07/16 0104 12/07/16 0503  BP: (!) 92/54 (!) 99/50  Pulse: 64 62  Resp: 18 18  Temp: 36.9 C 37.2 C  SpO2: 95% 98%    Last Pain:  Vitals:   12/07/16 0503  TempSrc: Oral   Pain Goal:                 Edison PaceWILKERSON,Kenlea Woodell

## 2016-12-07 NOTE — Plan of Care (Signed)
Problem: Activity: Goal: Risk for activity intolerance will decrease Discussed the importance of ambulating in hallway three times today. Patient getting out of bed on her own well.

## 2016-12-08 ENCOUNTER — Ambulatory Visit: Payer: Self-pay

## 2016-12-08 MED ORDER — SENNOSIDES-DOCUSATE SODIUM 8.6-50 MG PO TABS: 2.0000 | ORAL_TABLET | ORAL | 0 refills | Status: AC

## 2016-12-08 MED ORDER — OXYCODONE-ACETAMINOPHEN 5-325 MG PO TABS: 1.0000 | ORAL_TABLET | Freq: Four times a day (QID) | ORAL | 0 refills | Status: AC | PRN

## 2016-12-08 NOTE — Discharge Summary (Signed)
OB Discharge Summary     Patient Name: Courtney Mckee DOB: 23-Nov-1990 MRN: [id number]  Date of admission: 12/06/2016 Delivering MD: Shonna ChockWOUK, NOAH BEDFORD   Date of discharge: 12/08/2016  Admitting diagnosis: RCS Intrauterine pregnancy: 3326w1d     Secondary diagnosis:  Active Problems:   Status post repeat low transverse cesarean section  Additional problems:  Patient Active Problem List   Diagnosis Date Noted  . Status post repeat low transverse cesarean section 12/06/2016  . Positive GBS test 11/02/2016  . History of maternal vaginal laceration, currently pregnant 06/14/2016  . Previous cesarean delivery affecting pregnancy 05/17/2016  . Supervision of other normal pregnancy, antepartum 05/16/2016       Discharge diagnosis: Term Pregnancy Delivered                                                                                                Post partum procedures:none  Augmentation: none  Complications: None  Hospital course:  Sceduled C/S   26 y.o. yo G2P2001 at 3526w1d was admitted to the hospital 12/06/2016 for scheduled cesarean section with the following indication:Elective Repeat.  Membrane Rupture Time/Date: 12:07 PM ,12/06/2016   Patient delivered a Viable infant.12/06/2016  Details of operation can be found in separate operative note.  Pateint had an uncomplicated postpartum course.  She is ambulating, tolerating a regular diet, passing flatus, and urinating well. Patient is discharged home in stable condition on  12/08/16         Physical exam  Vitals:   12/07/16 0503 12/07/16 0900 12/07/16 1330 12/08/16 0515  BP: (!) 99/50 (!) 93/50 (!) 106/52 106/69  Pulse: 62 70 73 67  Resp: 18 18 16 18   Temp: 99 F (37.2 C) 98.6 F (37 C) 98.2 F (36.8 C) 98.2 F (36.8 C)  TempSrc: Oral Oral Oral Axillary  SpO2: 98% 99% 97% 99%  Weight:      Height:       General: alert, cooperative and no distress Lochia: appropriate Uterine Fundus: firm Incision: Dressing is clean,  dry, and intact DVT Evaluation: No evidence of DVT seen on physical exam. Labs: Lab Results  Component Value Date   WBC 11.5 (H) 12/07/2016   HGB 11.7 (L) 12/07/2016   HCT 32.4 (L) 12/07/2016   MCV 98.8 12/07/2016   PLT 121 (L) 12/07/2016   CMP Latest Ref Rng & Units 10/03/2016  Glucose 65 - 99 mg/dL 782(N104(H)  BUN 6 - 20 mg/dL 8  Creatinine 5.620.57 - 1.301.00 mg/dL 8.65(H0.51(L)  Sodium 846134 - 962144 mmol/L 142  Potassium 3.5 - 5.2 mmol/L 3.6  Chloride 96 - 106 mmol/L 106  CO2 20 - 29 mmol/L 19(L)  Calcium 8.7 - 10.2 mg/dL 8.7  Total Protein 6.0 - 8.5 g/dL 6.1  Total Bilirubin 0.0 - 1.2 mg/dL 0.3  Alkaline Phos 39 - 117 IU/L 179(H)  AST 0 - 40 IU/L 13  ALT 0 - 32 IU/L 15    Discharge instruction: per After Visit Summary and "Baby and Me Booklet".  After visit meds:  Allergies as of 12/08/2016      Reactions   Sulfa Antibiotics Other (  See Comments)   Unknown      Medication List    TAKE these medications   CITRANATAL BLOOM 90-1 MG Tabs Take 1 tablet by mouth daily.   oxyCODONE-acetaminophen 5-325 MG tablet Commonly known as:  ROXICET Take 1 tablet by mouth every 6 (six) hours as needed.   PRENATE PIXIE 10-0.6-0.4-200 MG Caps Take 1 tablet by mouth daily.   senna-docusate 8.6-50 MG tablet Commonly known as:  Senokot-S Take 2 tablets by mouth daily.       Diet: routine diet  Activity: Advance as tolerated. Pelvic rest for 6 weeks.   Outpatient follow up:4 weeks Follow up Appt:Future Appointments Date Time Provider Department Center  12/20/2016 1:15 PM Orvilla Cornwall A, CNM [facility] None  01/03/2017 1:00 PM Denney, Rachelle A, CNM [facility] None   Follow up Visit:No Follow-up on file.  Postpartum contraception: Nexplanon  Newborn Data: Live born female  Birth Weight: 7 lb 10.9 oz (3485 g) APGAR: 8, 9  Newborn Delivery   Birth date/time:  12/06/2016 12:08:00 Delivery type:  C-Section, Low Vertical  C-section categorization:  Repeat     Baby Feeding: Bottle and  Breast Disposition:home with mother   12/08/2016 Suella Broad, MD

## 2016-12-08 NOTE — Discharge Instructions (Signed)

## 2016-12-08 NOTE — Lactation Note (Signed)
This note was copied from a baby's chart. Lactation Consultation Note Mom BF when LC entered rm. RN stated she had just helped mom latch. Mom sitting in recliner BF in football position. Gave mom a folded baby blanket to place under her hand that is supporting baby's head. Dicussed "C" hold in guiding breast for latching. Asked mom if she had shells to wear. Mom stated she but hasn't been wearing them. Strongly encouraged mom to wear them to assist in everting nipples for baby to get a better latch. Not baby suckling nipple in like a spagetti noodle. Baby BF on Rt. Breast. Assessed Lt. Nipple, flat scabbed. Hand expressed colostrum, suggested to pat on nipple. Asked mom if she would be interested in trying a NS, mom stated no that she tried one with her son and didn't like it.  Asked mom if she had any more questions, mom stated no. Patient Name: Courtney Mckee NFAOZ'HToday's Date: 12/08/2016 Reason for consult: Follow-up assessment;Late-preterm 34-36.6wks   Maternal Data    Feeding Feeding Type: Breast Fed Length of feed: 10 min (still BF)  LATCH Score Latch: Repeated attempts needed to sustain latch, nipple held in mouth throughout feeding, stimulation needed to elicit sucking reflex.  Audible Swallowing: A few with stimulation  Type of Nipple: Flat  Comfort (Breast/Nipple): Filling, red/small blisters or bruises, mild/mod discomfort  Hold (Positioning): Assistance needed to correctly position infant at breast and maintain latch.  LATCH Score: 5  Interventions Interventions: Skin to skin;Support pillows;Breast compression;Hand express;Position options;Breast massage  Lactation Tools Discussed/Used Tools: Shells Shell Type: Inverted   Consult Status Consult Status: Follow-up Date: 12/08/16 Follow-up type: In-patient    Charyl DancerCARVER, Altair Appenzeller G 12/08/2016, 12:51 AM

## 2016-12-08 NOTE — Lactation Note (Signed)
This note was copied from a baby's chart. Lactation Consultation Note; Mother reports that infant is feeding well. She reports that nipple pain is minimal. Observed nipples and they are still slightly pink with left nipple scabbed.  Mother is using shells on left and comfort gel on the right nipple. Mother reports that she gave infant formula once after feeding.  Encouraged mother to continue to cue base feed and feed at least 8-12 times in 24 hours.  Mother has Medela pump at home. She was advised to pump for 15-20 mins after feedings if she feels a need to supplement infant. Advised to use ebm for supplement.  Mother offered to fit with a nipple shield. Mother declined stating that she had more pain with the Nipple shield with last child.  Infant is at 6% weight loss this am. Mother plans follow up with Peds on Monday. Mother informed of LC office services. Mother has phone number for office for breastfeeding questions or concerns.   Patient Name: Courtney Silvestre Mesiva Kerner WUJWJ'XToday's Date: 12/08/2016 Reason for consult: Follow-up assessment   Maternal Data    Feeding    LATCH Score                   Interventions    Lactation Tools Discussed/Used     Consult Status Consult Status: Complete    Michel BickersKendrick, Doreena Maulden McCoy 12/08/2016, 1:32 PM

## 2016-12-08 NOTE — Progress Notes (Signed)
Discharge teaching complete with pt. Pt understood all information and did not have any questions. 

## 2016-12-20 ENCOUNTER — Ambulatory Visit (INDEPENDENT_AMBULATORY_CARE_PROVIDER_SITE_OTHER): Payer: Medicaid Other | Admitting: Certified Nurse Midwife

## 2016-12-20 VITALS — BP 111/81 | HR 98 | Wt 160.0 lb

## 2016-12-20 DIAGNOSIS — L03311 Cellulitis of abdominal wall: Secondary | ICD-10-CM

## 2016-12-20 MED ORDER — CEPHALEXIN 500 MG PO CAPS: 500.0000 mg | ORAL_CAPSULE | Freq: Three times a day (TID) | ORAL | 0 refills | Status: AC

## 2016-12-20 NOTE — Progress Notes (Signed)
Pt had c/s on 12/06/16. Pt states she is doing well. Pt states she is interested in Nexplanon. Pt had cold symptoms yesterday/last night, feeling better today.

## 2016-12-21 ENCOUNTER — Encounter: Payer: Self-pay | Admitting: Certified Nurse Midwife

## 2016-12-21 NOTE — Progress Notes (Signed)
Patient ID: Silvestre Mesiva Mixson, female   DOB: 1990-03-21, 26 y.o.   MRN: [id number]  Chief Complaint  Patient presents with  . Wound Check    incision check    HPI Courtney Mckee is a 26 y.o. female.  Patient is here for post-operative wound check.  Had a repeat C-section on 12/06/16.  This was her second C-section.  Is currently breast feeding and reports that is going well.  Reports some pain on right side of incision with activity or wearing clothes.  Denies fever, muscle pain otherwise.  Is taking motrin.  Able to do ADLs.    HPI  Past Medical History:  Diagnosis Date  . Anemia   . Blood transfusion without reported diagnosis    not related to CS  . Pilonidal cyst     Past Surgical History:  Procedure Laterality Date  . CESAREAN SECTION    . CESAREAN SECTION N/A 12/06/2016   Procedure: REPEAT CESAREAN SECTION;  Surgeon: Kathrynn RunningWouk, Noah Bedford, MD;  [facility];  Service: Obstetrics;  Laterality: N/A;  . CYST REMOVAL HAND Right   . PILONIDAL CYST EXCISION    . VAGINAL WOUND CLOSURE / REPAIR     post coidal  . WISDOM TOOTH EXTRACTION  2007    Family History  Problem Relation Age of Onset  . Mental illness Mother   . Cancer Maternal Grandmother   . Depression Maternal Grandmother     Social History Social History   Tobacco Use  . Smoking status: Never Smoker  . Smokeless tobacco: Never Used  Substance Use Topics  . Alcohol use: No    Comment: Occas. prior to preg.  . Drug use: No    Allergies  Allergen Reactions  . Sulfa Antibiotics Other (See Comments)    Unknown    Current Outpatient Medications  Medication Sig Dispense Refill  . cephALEXin (KEFLEX) 500 MG capsule Take 1 capsule (500 mg total) 3 (three) times daily by mouth. 42 capsule 0  . oxyCODONE-acetaminophen (ROXICET) 5-325 MG tablet Take 1 tablet by mouth every 6 (six) hours as needed. (Patient not taking: Reported on 12/20/2016) 20 tablet 0  . Prenat-FeAsp-Meth-FA-DHA w/o A (PRENATE PIXIE)  10-0.6-0.4-200 MG CAPS Take 1 tablet by mouth daily. (Patient not taking: Reported on 12/20/2016) 30 capsule 12  . Prenatal-DSS-FeCb-FeGl-FA (CITRANATAL BLOOM) 90-1 MG TABS Take 1 tablet by mouth daily. (Patient not taking: Reported on 12/20/2016) 30 tablet 12  . senna-docusate (SENOKOT-S) 8.6-50 MG tablet Take 2 tablets by mouth daily. (Patient not taking: Reported on 12/20/2016) 60 tablet 0   No current facility-administered medications for this visit.     Review of Systems Review of Systems Constitutional: negative for fatigue and weight loss Respiratory: negative for cough and wheezing Cardiovascular: negative for chest pain, fatigue and palpitations Gastrointestinal: negative for abdominal pain and change in bowel habits Genitourinary: + post-op C-section Integument/breast: negative for nipple discharge Musculoskeletal:negative for myalgias Neurological: negative for gait problems and tremors Behavioral/Psych: negative for abusive relationship, depression Endocrine: negative for temperature intolerance      Blood pressure 111/81, pulse 98, weight 160 lb (72.6 kg), last menstrual period 02/29/2016, unknown if currently breastfeeding.  Physical Exam Physical Exam General:   alert  Skin:   no rash or abnormalities  Lungs:   clear to auscultation bilaterally  Heart:   regular rate and rhythm, S1, S2 normal, no murmur, click, rub or gallop  Breasts:   deferred  Abdomen:  normal findings: no organomegaly, soft, non-tender and no  hernia C-section wound: erythema noted along wound edge, slightly tender to palpation, steri-strips removed.  Slight odor noted.    Pelvis:  deferred    50% of 20 min visit spent on counseling and coordination of care.    Data Reviewed Previous medical hx, meds, labs, OP note & Discharge summery.   Assessment     Post-op C/S wound cellulitis: antibiotic therapy started.     Plan    F/U in 2 weeks for wound check, Nexplanon and Postpartum exam.    Meds ordered this encounter  Medications  . cephALEXin (KEFLEX) 500 MG capsule    Sig: Take 1 capsule (500 mg total) 3 (three) times daily by mouth.    Dispense:  42 capsule    Refill:  0

## 2017-01-03 ENCOUNTER — Encounter: Payer: Self-pay | Admitting: Certified Nurse Midwife

## 2017-01-03 ENCOUNTER — Ambulatory Visit (INDEPENDENT_AMBULATORY_CARE_PROVIDER_SITE_OTHER): Payer: Medicaid Other | Admitting: Certified Nurse Midwife

## 2017-01-03 VITALS — BP 111/71 | HR 80 | Wt 162.7 lb

## 2017-01-03 DIAGNOSIS — Z3202 Encounter for pregnancy test, result negative: Secondary | ICD-10-CM

## 2017-01-03 DIAGNOSIS — Z3049 Encounter for surveillance of other contraceptives: Secondary | ICD-10-CM

## 2017-01-03 DIAGNOSIS — Z1389 Encounter for screening for other disorder: Secondary | ICD-10-CM | POA: Diagnosis not present

## 2017-01-03 DIAGNOSIS — Z30017 Encounter for initial prescription of implantable subdermal contraceptive: Secondary | ICD-10-CM | POA: Insufficient documentation

## 2017-01-03 LAB — POCT URINE PREGNANCY: Preg Test, Ur: NEGATIVE

## 2017-01-03 MED ORDER — ETONOGESTREL 68 MG ~~LOC~~ IMPL
68.0000 mg | DRUG_IMPLANT | Freq: Once | SUBCUTANEOUS | Status: AC
  Administered 2017-01-03: 68 mg via SUBCUTANEOUS

## 2017-01-03 NOTE — Progress Notes (Signed)
Post Partum Exam  Courtney Mckee is a 26 y.o. 432P2001 female who presents for a postpartum visit. She is 4 weeks postpartum following a transverse c section. I have fully reviewed the prenatal and intrapartum course. The delivery was at 40.1 gestational weeks.  Anesthesia: epidural. Postpartum course has been good. Baby's course has been good. Baby is feeding by bottle - Carnation Good Start. Bleeding no bleeding. Bowel function is normal. Bladder function is normal. Patient is not sexually active. Contraception method is Nexplanon. Postpartum depression screening:  2  The following portions of the patient's history were reviewed and updated as appropriate: allergies, current medications, past family history, past medical history, past social history, past surgical history and problem list.  Review of Systems Pertinent items noted in HPI and remainder of comprehensive ROS otherwise negative.    Objective:  Blood pressure 111/71, pulse 80, weight 162 lb 11.2 oz (73.8 kg), last menstrual period 02/29/2016, unknown if currently breastfeeding.  General:  alert, cooperative and no distress   Breasts:  inspection negative, no nipple discharge or bleeding, no masses or nodularity palpable  Lungs: clear to auscultation bilaterally  Heart:  regular rate and rhythm, S1, S2 normal, no murmur, click, rub or gallop  Abdomen: soft, non-tender; bowel sounds normal; no masses,  no organomegaly.  C/S wound: C/D/I, no s/s infection, no erythema, no drainage  Pelvic Exam: Not performed.       Pap smear: 05/17/16: normal Assessment:    Normal 4 week postpartum exam. Pap smear not done at today's visit.   Plan:   1. Contraception: abstinence 2. Nexplanon placed today 3. Follow up in: 5 months for annual exam or as needed.

## 2017-01-03 NOTE — Progress Notes (Signed)
Nexplanon Procedure Note   PRE-OP DIAGNOSIS: desired long-term, reversible contraception  POST-OP DIAGNOSIS: Same  PROCEDURE: Nexplanon  placement Performing Provider: Orvilla Cornwallachelle Ignatz Deis CNM   Patient education prior to procedure, explained risk, benefits of Nexplanon, reviewed alternative options. Patient reported understanding. Gave consent to continue with procedure.   PROCEDURE:  Pregnancy Text :  Negative Site (check):      left arm         Sterile Preparation:   Betadinex3 Lot # [id number]  [id number] Expiration Date 06/2019  Insertion site was selected 8 - 10 cm from medial epicondyle and marked along with guiding site using sterile marker. Procedure area was prepped and draped in a sterile fashion. 1% Lidocaine 1.5 ml given prior to procedure. Nexplanon  was inserted subcutaneously.Needle was removed from the insertion site. Nexplanon capsule was palpated by provider and patient to assure satisfactory placement. And a bandage applied and the arm was wrapped with gauze bandage.     Followup: The patient tolerated the procedure well without complications.  Instructions:  The patient was instructed to remove the dressing in 24 hours and that some bruising is to be expected.  She was advised to use over the counter analgesics as needed for any pain at the site.  She is to keep the area dry for 24 hours and to call if her hand or arm becomes cold, numb, or blue.   Orvilla Cornwallachelle Arrion Broaddus CNM

## 2018-04-24 IMAGING — US US MFM OB COMP +14 WKS
1 series · 14 of 28 positions shown · non-contrast
Comparison: none

[Series 1: us mfm ob comp +14 wks · 14 of 57 slices shown]
[im 3/57]
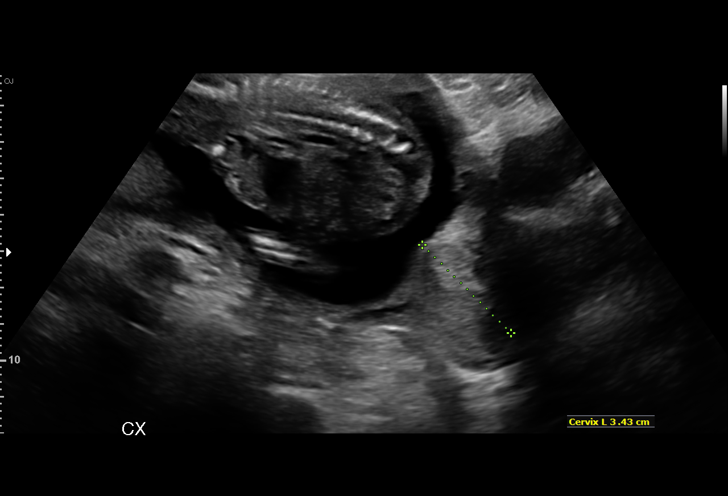
[im 7/57]
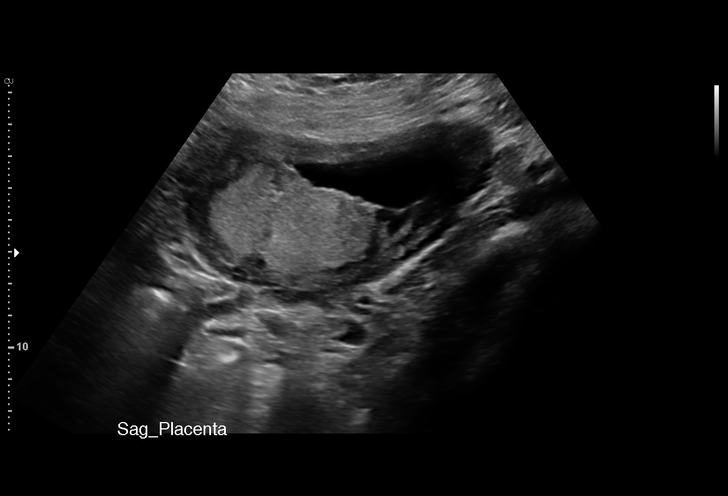
[im 11/57]
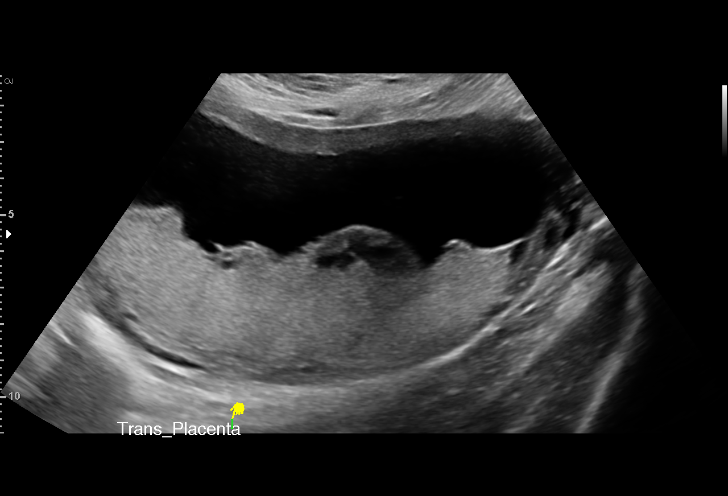
[im 15/57]
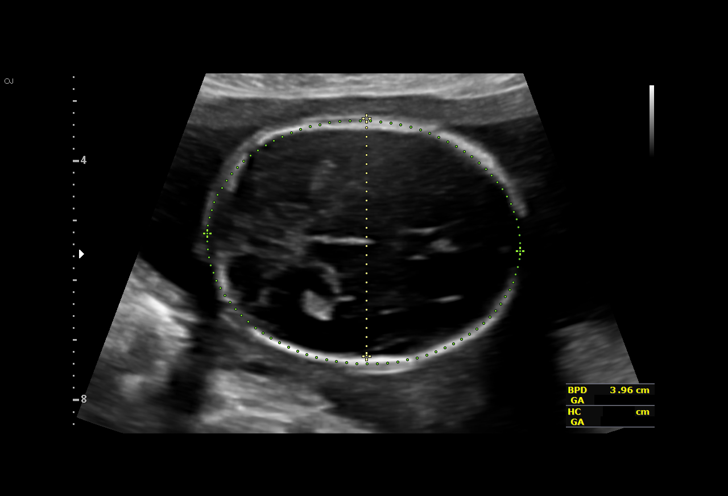
[im 19/57]
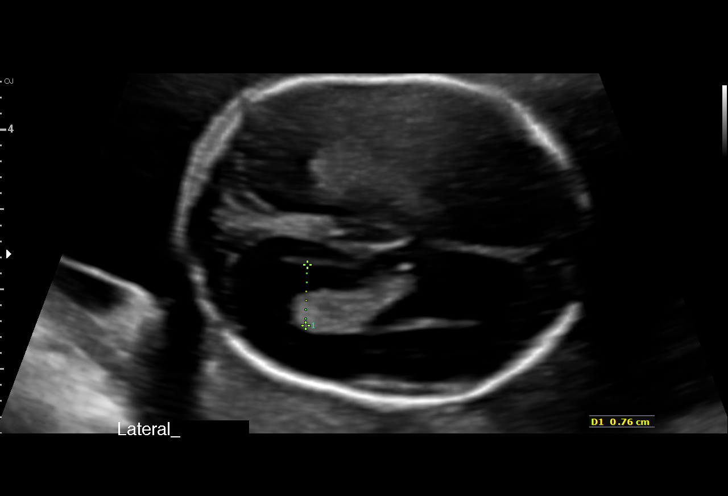
[im 23/57]
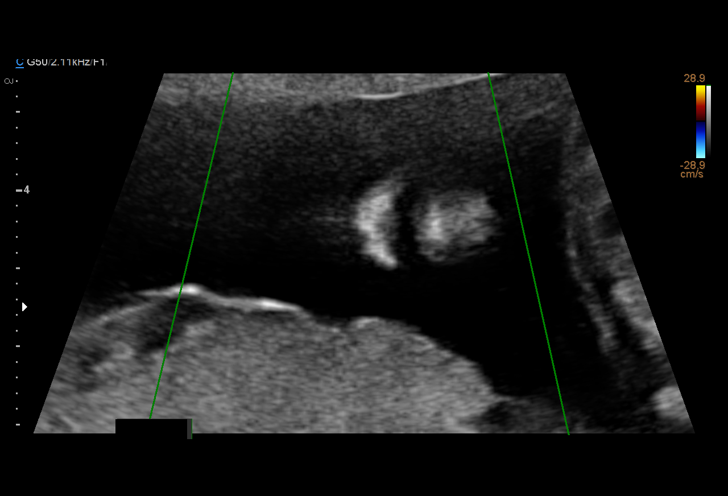
[im 27/57]
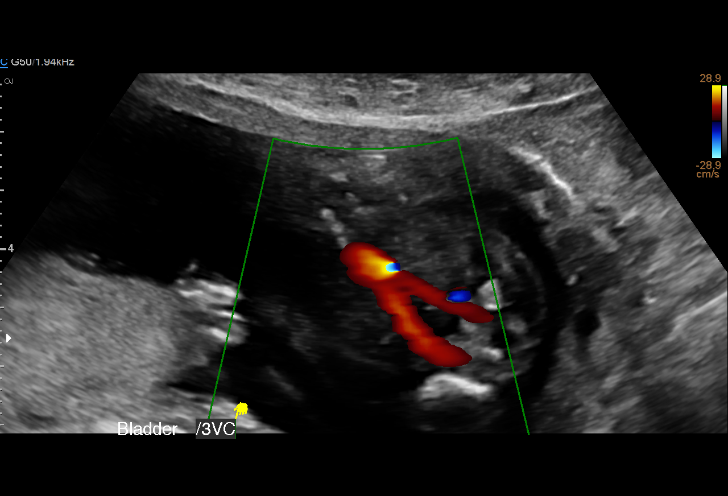
[im 32/57]
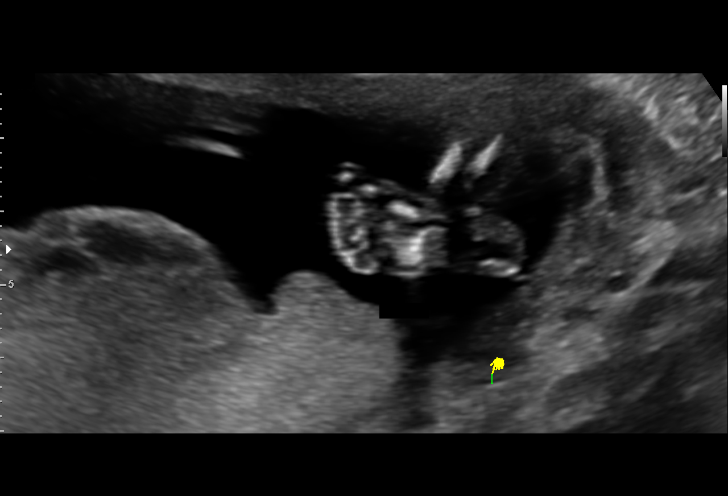
[im 36/57]
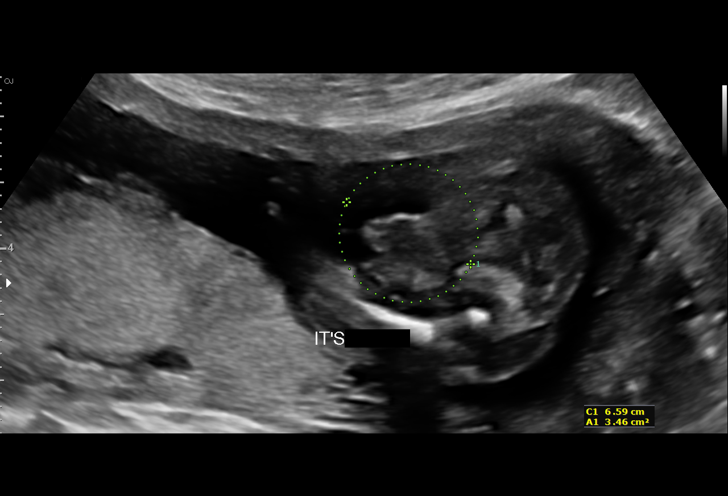
[im 40/57]
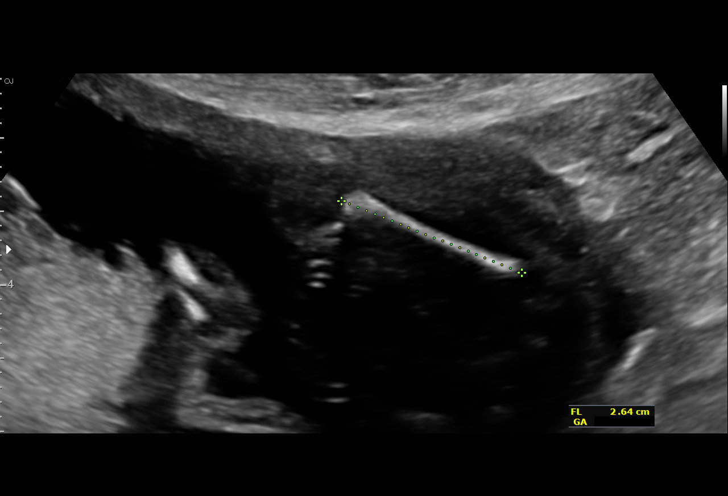
[im 44/57]
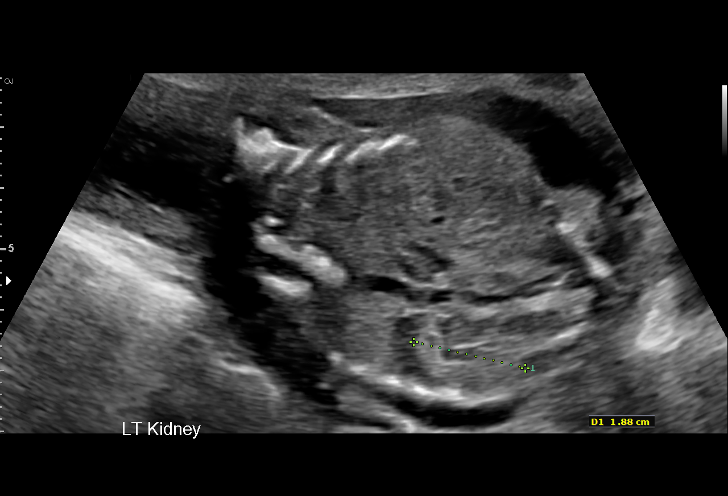
[im 48/57]
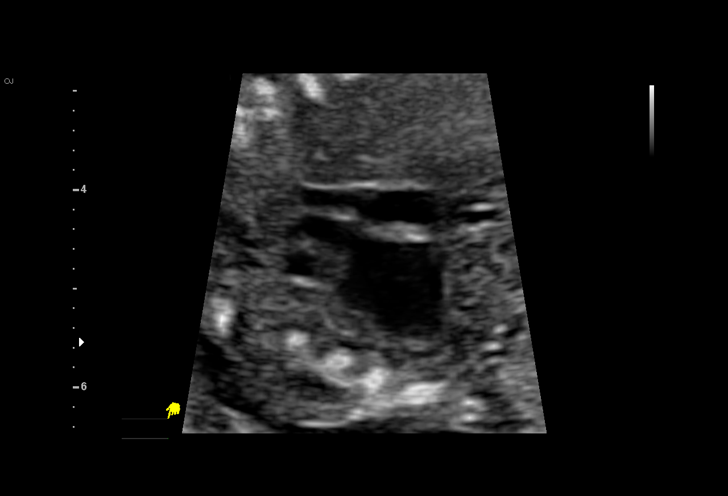
[im 52/57]
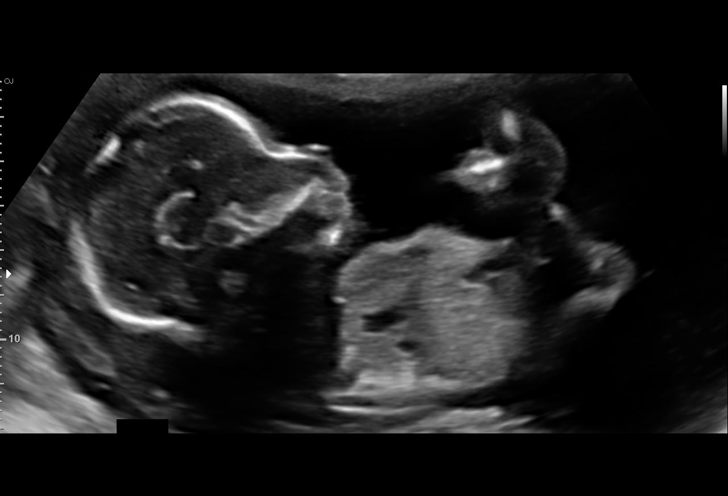
[im 57/57]
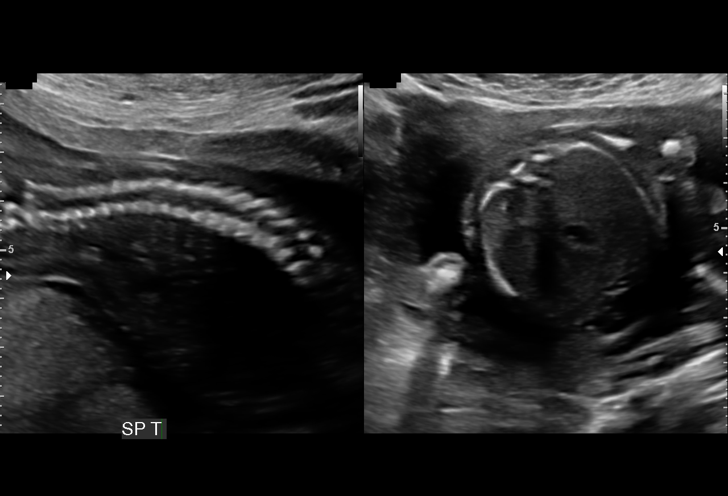

[14 of 28 positions shown; findings below may reference images not displayed]

Road [HOSPITAL]

Indications

19 weeks gestation of pregnancy
Encounter for antenatal screening for
malformations
Previous cesarean delivery, antepartum
OB History

Blood Type:            Height:  5'1"   Weight (lb):  155      BMI:
Gravidity:    2         Term:   1        Prem:   0        SAB:   0
TOP:          0       Ectopic:  0        Living: 1
Fetal Evaluation

Num Of Fetuses:     1
Fetal Heart         154
Rate(bpm):
Cardiac Activity:   Observed
Presentation:       Breech
Placenta:           Anterior, above cervical os
P. Cord Insertion:  Visualized

Amniotic Fluid
AFI FV:      Subjectively within normal limits

Largest Pocket(cm)
4.2
Biometry

BPD:      39.8  mm     G. Age:  18w 0d         11  %    CI:         72.9   %   70 - 86
FL/HC:      17.9   %   16.1 -
HC:      148.2  mm     G. Age:  17w 6d          4  %    HC/AC:      1.04       1.09 -
AC:       143   mm     G. Age:  19w 4d         62  %    FL/BPD:     66.6   %
FL:       26.5  mm     G. Age:  18w 0d         11  %    FL/AC:      18.5   %   20 - 24
CER:      17.9  mm     G. Age:  17w 6d         15  %
NFT:       3.1  mm
CM:        4.8  mm

Est. FW:     257  gm      0 lb 9 oz     38  %
Gestational Age

LMP:           19w 1d       Date:   02/29/16                 EDD:   12/05/16
U/S Today:     18w 3d                                        EDD:   12/10/16
Best:          19w 1d    Det. By:   LMP  (02/29/16)          EDD:   12/05/16
Anatomy

Cranium:               Appears normal         Aortic Arch:            Not well visualized
Cavum:                 Appears normal         Ductal Arch:            Appears normal
Ventricles:            Appears normal         Diaphragm:              Appears normal
Choroid Plexus:        Appears normal         Stomach:                Appears normal, left
sided
Cerebellum:            Appears normal         Abdomen:                Appears normal
Posterior Fossa:       Appears normal         Abdominal Wall:         Appears nml (cord
insert, abd wall)
Nuchal Fold:           Appears normal         Cord Vessels:           Appears normal (3
vessel cord)
Face:                  Appears normal         Kidneys:                Appear normal
(orbits and profile)
Lips:                  Appears normal         Bladder:                Appears normal
Thoracic:              Appears normal         Spine:                  Not well visualized
Heart:                 Appears normal         Upper Extremities:      Appears normal
(4CH, axis, and situs
RVOT:                  Not well visualized    Lower Extremities:      Appears normal
LVOT:                  Appears normal

Other:  Male gender. 5th digit visualized.
Cervix Uterus Adnexa

Cervix
Length:           3.43  cm.
Normal appearance by transabdominal scan.

Uterus
No abnormality visualized.

Left Ovary
Size(cm)       3.1 x    1.5    x  2.4       Vol(ml):
Within normal limits.

Right Ovary
Size(cm)       3.1 x    1.9    x  1.6       Vol(ml):
Within normal limits.
Impression

Singleton intrauterine pregnancy at 19+1 weeks, here for
anatomic survey
Review of the anatomy shows no sonographic markers for
aneuploidy or structural anomalies
However, heart and spine evaluations should be considered
suboptimal secondary to fetal position
Amniotic fluid volume is normal
Estimated fetal weight is 257g which is growth in the 38th
percentile
Recommendations

Consider repeat evaluation in 4 weeks to complete anatomic
survey

## 2018-05-24 IMAGING — US US MFM OB FOLLOW-UP
1 series · 14 of 28 positions shown · non-contrast
Comparison: none

[Series 1: us mfm ob follow-up · 14 of 52 slices shown]
[im 2/52]
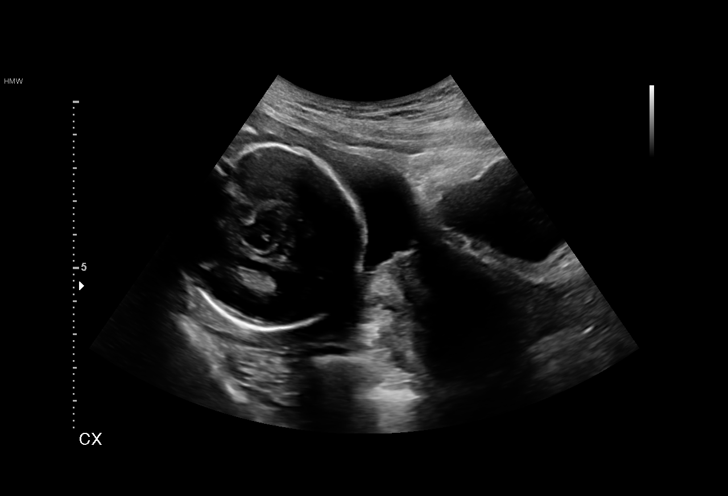
[im 6/52]
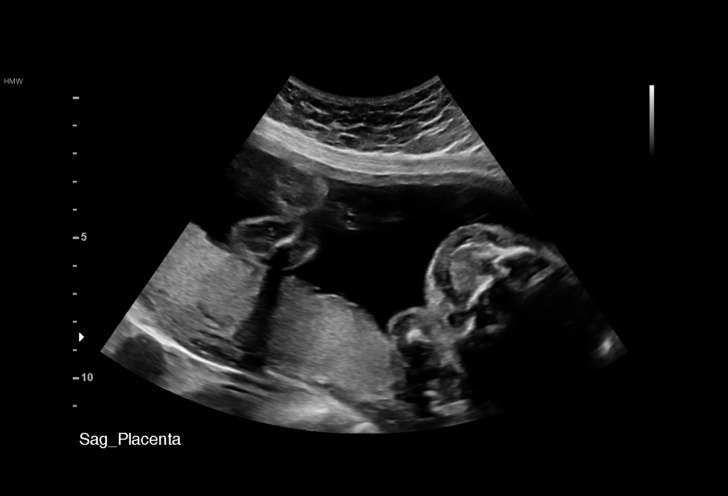
[im 10/52]
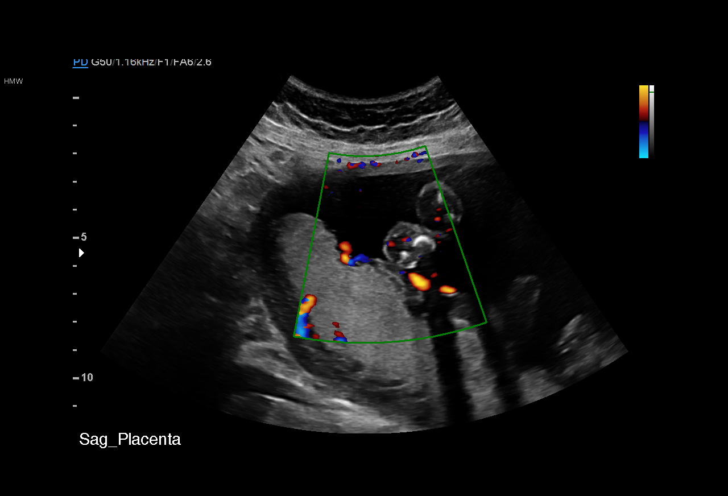
[im 14/52]
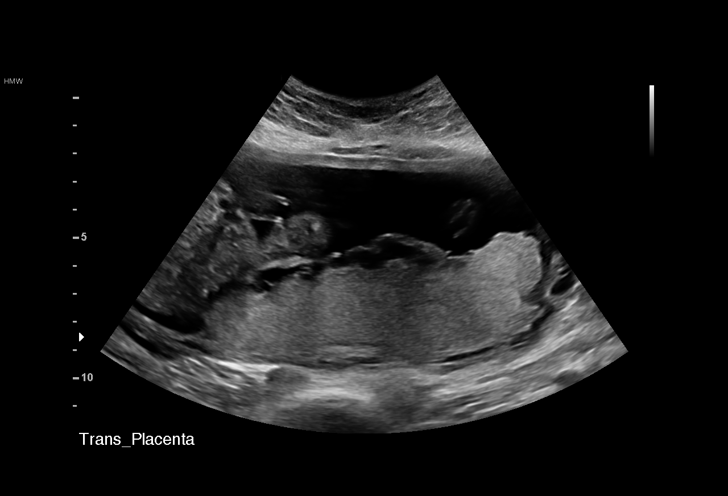
[im 18/52]
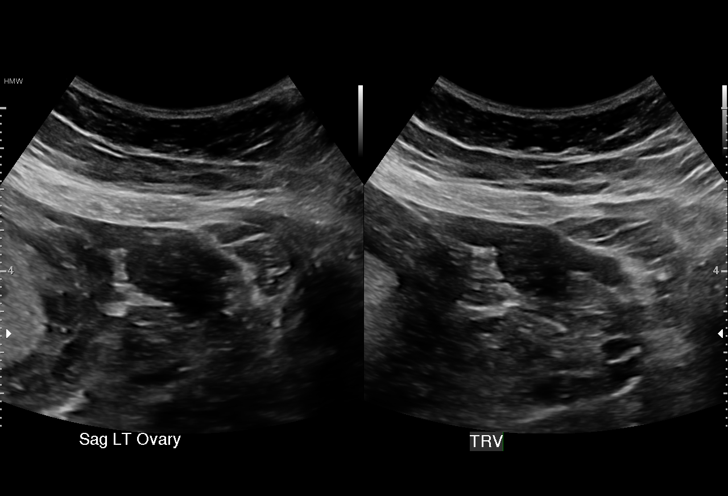
[im 21/52]
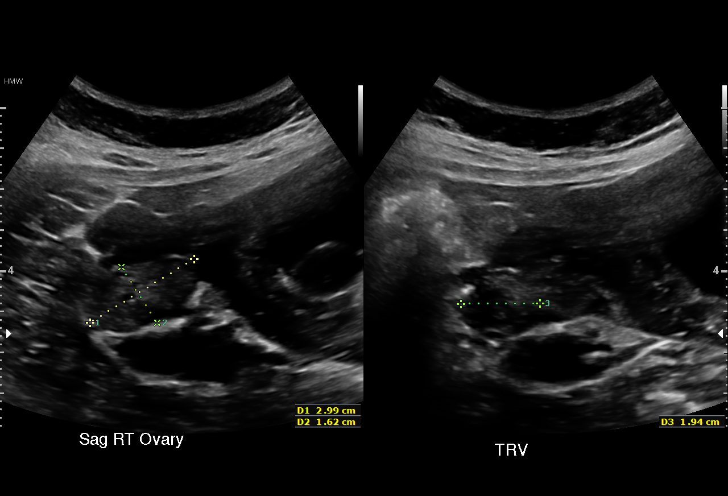
[im 25/52]
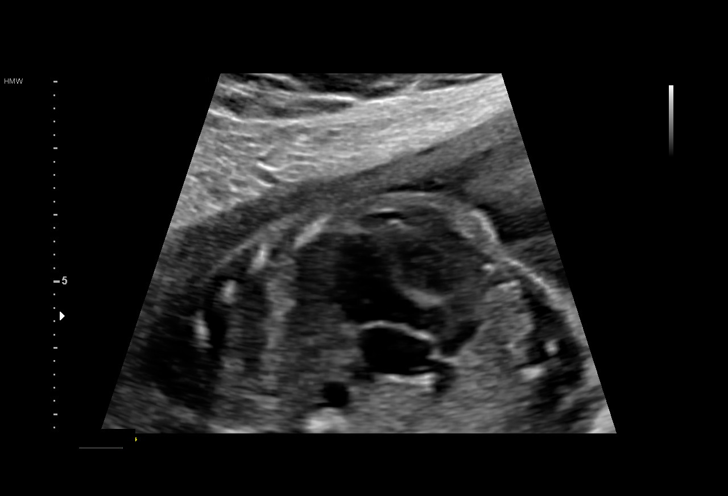
[im 29/52]
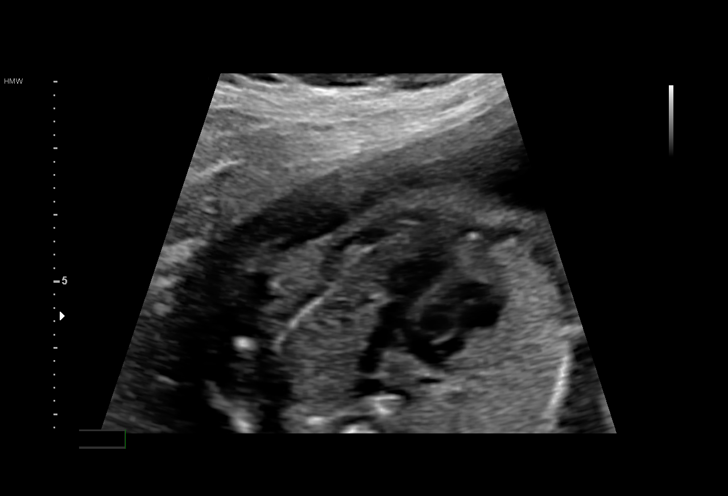
[im 33/52]
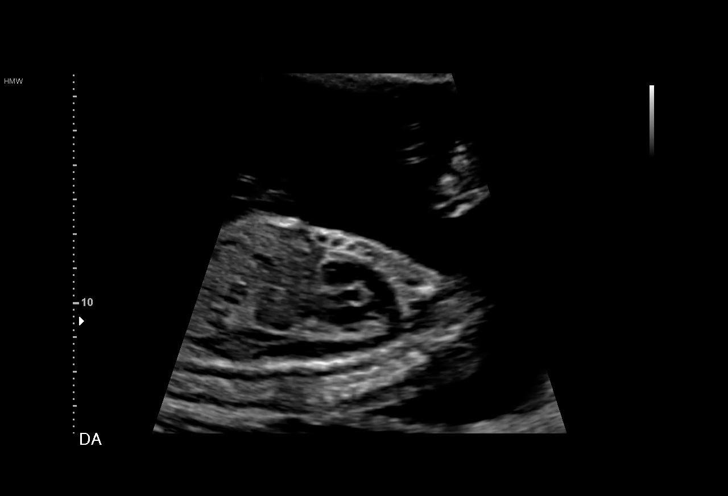
[im 36/52]
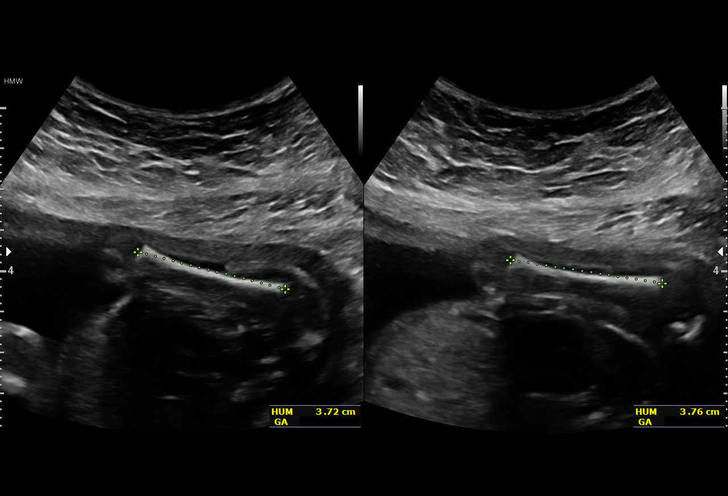
[im 40/52]
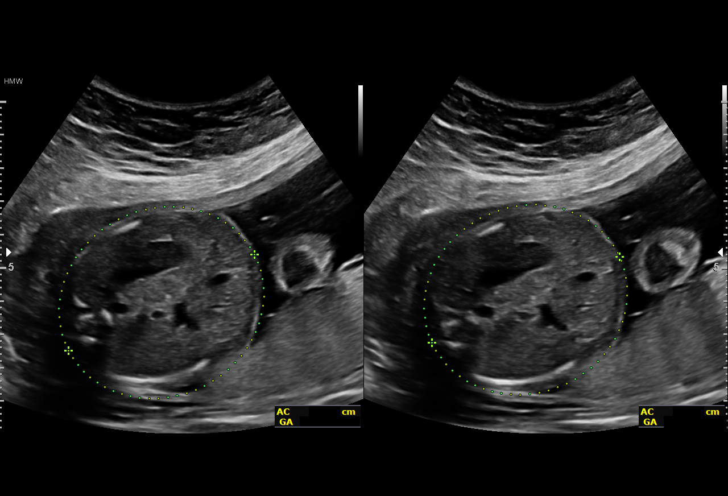
[im 44/52]
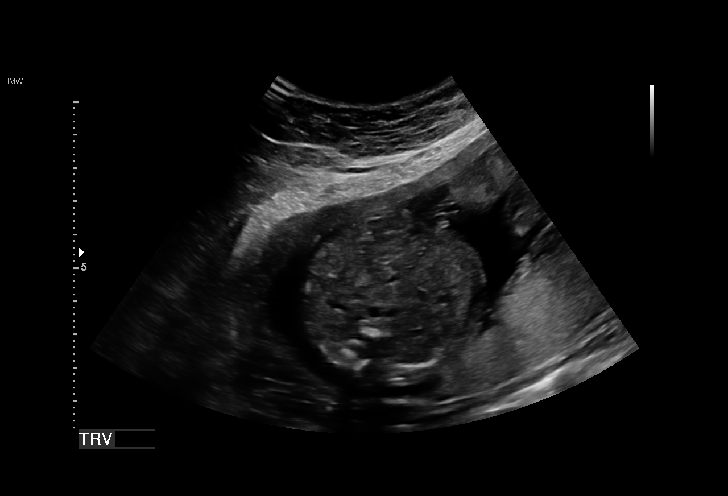
[im 48/52]
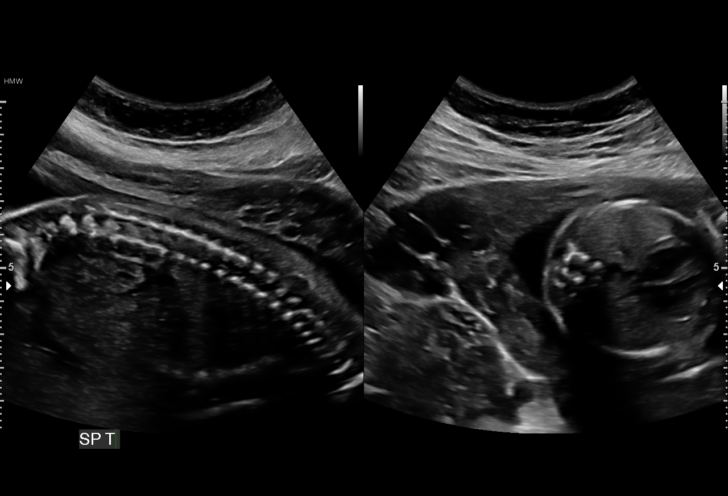
[im 52/52]
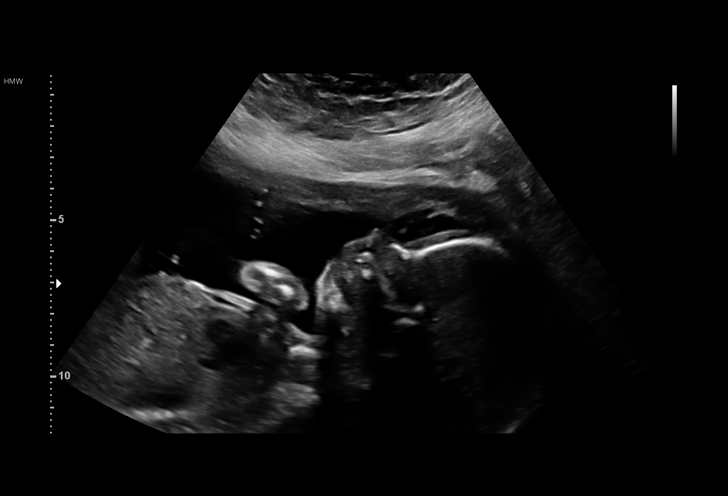

[14 of 28 positions shown; findings below may reference images not displayed]

Road [HOSPITAL]

Indications

23 weeks gestation of pregnancy
Previous cesarean delivery, antepartum
Encounter for other antenatal screening
follow-up
OB History

Blood Type:            Height:  5'1"   Weight (lb):  155      BMI:
Gravidity:    2         Term:   1        Prem:   0        SAB:   0
TOP:          0       Ectopic:  0        Living: 1
Fetal Evaluation

Num Of Fetuses:     1
Fetal Heart         142
Rate(bpm):
Cardiac Activity:   Observed
Presentation:       Cephalic
Placenta:           Posterior, above cervical os
P. Cord Insertion:  Visualized, central

Amniotic Fluid
AFI FV:      Subjectively within normal limits

Largest Pocket(cm)
4.1
Biometry

BPD:      52.1  mm     G. Age:  21w 6d          4  %    CI:         69.2   %   70 - 86
FL/HC:      18.6   %   19.2 -
HC:       200   mm     G. Age:  22w 1d          4  %    HC/AC:      1.07       1.05 -
AC:      186.5  mm     G. Age:  23w 3d         42  %    FL/BPD:     71.4   %   71 - 87
FL:       37.2  mm     G. Age:  21w 6d          5  %    FL/AC:      19.9   %   20 - 24
HUM:      37.4  mm     G. Age:  23w 1d         36  %
Est. FW:     521  gm      1 lb 2 oz     34  %
Gestational Age

LMP:           23w 3d       Date:   02/29/16                 EDD:   12/05/16
U/S Today:     22w 2d                                        EDD:   12/13/16
Best:          23w 3d    Det. By:   LMP  (02/29/16)          EDD:   12/05/16
Anatomy

Cranium:               Appears normal         Aortic Arch:            Appears normal
Cavum:                 Previously seen        Ductal Arch:            Previously seen
Ventricles:            Appears normal         Diaphragm:              Previously seen
Choroid Plexus:        Previously seen        Stomach:                Appears normal, left
sided
Cerebellum:            Previously seen        Abdomen:                Previously seen
Posterior Fossa:       Previously seen        Abdominal Wall:         Previously seen
Nuchal Fold:           Previously seen        Cord Vessels:           Previously seen
Face:                  Orbits and profile     Kidneys:                Appear normal
previously seen
Lips:                  Previously seen        Bladder:                Appears normal
Thoracic:              Appears normal         Spine:                  Appears normal
Heart:                 Appears normal         Upper Extremities:      Previously seen
(4CH, axis, and situs
RVOT:                  Appears normal         Lower Extremities:      Previously seen
LVOT:                  Appears normal

Other:  Male gender. 5th digit visualized previously.
Cervix Uterus Adnexa

Cervix
Length:            3.9  cm.
Normal appearance by transabdominal scan.

Uterus
No abnormality visualized.

Left Ovary
No adnexal mass visualized. Within normal limits.

Right Ovary
No adnexal mass visualized. Within normal limits.

Cul De Sac:   No free fluid seen.

Adnexa:       No abnormality visualized.
Impression

Singleton intrauterine pregnancy at 23 weeks 3 days
gestation with fetal cardiac activity
Cephalic presentation
Normal appearing fetal growth and amniotic fluid volume
Completion of fetal anatomic survey
Normal appearing cervical length
Recommendations

Follow-up ultrasounds as clinically indicated.

## 2019-12-29 ENCOUNTER — Encounter: Payer: Self-pay | Admitting: Advanced Practice Midwife

## 2019-12-29 ENCOUNTER — Ambulatory Visit (INDEPENDENT_AMBULATORY_CARE_PROVIDER_SITE_OTHER): Payer: Medicaid Other | Admitting: Advanced Practice Midwife

## 2019-12-29 ENCOUNTER — Other Ambulatory Visit: Payer: Self-pay

## 2019-12-29 VITALS — BP 115/75 | HR 91 | Ht 60.0 in | Wt 174.0 lb

## 2019-12-29 DIAGNOSIS — Z3046 Encounter for surveillance of implantable subdermal contraceptive: Secondary | ICD-10-CM

## 2019-12-29 NOTE — Progress Notes (Signed)
GYN presents for Nexplanon removal, she is ready to conceive.

## 2019-12-29 NOTE — Progress Notes (Signed)
GYNECOLOGY CLINIC PROCEDURE NOTE  Courtney Mckee is a 29 y.o. G2P2001 here for Nexplanon removal due to being ready to conceive.  Last pap smear was on 05/17/2016  and was normal.  No other gynecologic concerns.  Nexplanon Removal Patient identified, informed consent performed, consent signed.   Appropriate time out taken. Nexplanon site identified.  Area prepped in usual sterile fashon. One ml of 1% lidocaine was used to anesthetize the area at the distal end of the implant. A small stab incision was made right beside the implant on the distal portion.  The Nexplanon rod was grasped using hemostats and removed without difficulty.  There was minimal blood loss. There were no complications.  3 ml of 1% lidocaine was injected around the incision for post-procedure analgesia.  Steri-strips were applied over the small incision.  A pressure bandage was applied to reduce any bruising.  The patient tolerated the procedure well and was given post procedure instructions.  Patient is planning to attempt conception.  Courtney Mckee, Student-MidWife 12:00 PM   CNM attestation:  I have seen and examined this patient; I agree with above documentation in the midwife student's note.   Courtney Mckee is a 29 y.o. G2P2001 in the [facility] office for Nexplanon removal visit.  Nexplanon was placed 3 years ago. Pt does not desire new form of contraception because she plans conception soon.  She has no gyn concerns or complaints.   Patient Active Problem List   Diagnosis Date Noted  . Nexplanon insertion 01/03/2017  . Status post repeat low transverse cesarean section 12/06/2016  . Positive GBS test 11/02/2016  . History of maternal vaginal laceration, currently pregnant 06/14/2016  . Previous cesarean delivery affecting pregnancy 05/17/2016  . Supervision of other normal pregnancy, antepartum 05/16/2016     ROS, labs, PMH reviewed  PE: BP 115/75   Pulse 91   Ht 5' (1.524 m)   Wt 174 lb (78.9 kg)   BMI 33.98  kg/m  Gen: calm comfortable, well appearing Resp: normal effort, no distress Abd: soft, no masses palpated  Plan: F/U in 1 year for routine well woman exam -    1. Nexplanon removal --nexplanon removed without difficulty, see procedure note above    Sharen Counter, CNM 4:27 PM

## 2020-02-03 ENCOUNTER — Other Ambulatory Visit: Payer: Self-pay

## 2020-02-03 ENCOUNTER — Other Ambulatory Visit (HOSPITAL_COMMUNITY)
Admission: RE | Admit: 2020-02-03 | Discharge: 2020-02-03 | Disposition: A | Discharge status: Home / Self Care | Payer: Medicaid Other | Source: Ambulatory Visit | Attending: Advanced Practice Midwife | Admitting: Advanced Practice Midwife

## 2020-02-03 ENCOUNTER — Encounter: Payer: Self-pay | Admitting: Advanced Practice Midwife

## 2020-02-03 ENCOUNTER — Ambulatory Visit (INDEPENDENT_AMBULATORY_CARE_PROVIDER_SITE_OTHER): Payer: Self-pay | Admitting: Advanced Practice Midwife

## 2020-02-03 VITALS — BP 111/69 | HR 79 | Ht 60.0 in | Wt 176.0 lb

## 2020-02-03 DIAGNOSIS — Z113 Encounter for screening for infections with a predominantly sexual mode of transmission: Secondary | ICD-10-CM | POA: Diagnosis present

## 2020-02-03 DIAGNOSIS — Z01419 Encounter for gynecological examination (general) (routine) without abnormal findings: Secondary | ICD-10-CM | POA: Insufficient documentation

## 2020-02-03 DIAGNOSIS — L02422 Furuncle of left axilla: Secondary | ICD-10-CM

## 2020-02-03 LAB — POCT URINE PREGNANCY: Preg Test, Ur: NEGATIVE

## 2020-02-03 NOTE — Progress Notes (Signed)
GYN presents for AEX/PAP/STD screening.  Reports no problems today.

## 2020-02-03 NOTE — Progress Notes (Signed)
Subjective:     Courtney Mckee is a 29 y.o. female here at [facility] for a routine exam.  Current complaints: bump in left armpit x 1-2 years, sometimes painful.  No gyn concerns or problems. Had light menses this month, Nexplanon removed last month. Pt interested in conceiving in the near future, and would like pregnancy test. Personal health questionnaire reviewed: yes.  Do you have a primary care provider? yes Do you feel safe at home? yes  Flowsheet Row Office Visit from 02/03/2020 in [facility]  PHQ-2 Total Score 0      Risk factors for chronic health problems: Smoking: Never smoker Alchohol/how much: None Pt BMI: Body mass index is 34.37 kg/m.   Gynecologic History Patient's last menstrual period was 01/28/2020 (exact date). Contraception: none Last Pap: 2018. Results were: normal Last mammogram: n/a.   Obstetric History OB History  Gravida Para Term Preterm AB Living  2 2 2     1   SAB IAB Ectopic Multiple Live Births        0 1    # Outcome Date GA Lbr Len/2nd Weight Sex Delivery Anes PTL Lv  2 Term 12/06/16 [redacted]w[redacted]d  7 lb 10.9 oz (3.485 kg) M CS-LVertical Spinal    1 Term 08/04/13 [redacted]w[redacted]d   M CS-LTranv Gen  LIV     Complications: Failure to Progress in Second Stage     The following portions of the patient's history were reviewed and updated as appropriate: allergies, current medications, past family history, past medical history, past social history, past surgical history and problem list.  Review of Systems Pertinent items noted in HPI and remainder of comprehensive ROS otherwise negative.    Objective:   BP 111/69   Pulse 79   Ht 5' (1.524 m)   Wt 176 lb (79.8 kg)   LMP 01/28/2020 (Exact Date)   BMI 34.37 kg/m  VS reviewed, nursing note reviewed,  Constitutional: well developed, well nourished, no distress HEENT: normocephalic CV: normal rate Pulm/chest wall: normal effort Breast Exam:  Deferred with low risks and shared  decision making, discussed recommendation to start mammogram between 40-50 yo/ exam  Abdomen: soft Neuro: alert and oriented x 3 Skin: warm, dry Psych: affect normal Pelvic exam: Performed: Cervix pink, visually closed, without lesion, scant white creamy discharge, vaginal walls and external genitalia normal Bimanual exam: Cervix 0/long/high, firm, anterior, neg CMT, uterus nontender, nonenlarged, adnexa without tenderness, enlargement, or mass       Assessment/Plan:   1. Screen for STD (sexually transmitted disease)  - Cervicovaginal ancillary only( [place]) - RPR+HBsAg+HCVAb+...  2. Well woman exam with routine gynecological exam --Doing well, no concerns or problems --Recommend PNV or folic acid prior to conception --F/U in 1 year or as needed  - Cytology - PAP( [place]) - POCT urine pregnancy  3. Furuncle of left axilla --Hard round smooth mobile area of left axilla, mildly tender to palpation. Pt has taken abx before but it did not shrink.  Recommend f/u with PCP and/or dermatology.    Follow up in: 1 year or as needed.   01/30/2020, CNM 9:05 AM

## 2020-02-03 NOTE — Patient Instructions (Signed)
Preparing for Pregnancy °If you are considering becoming pregnant, make an appointment to see your regular health care provider to learn how to prepare for a safe and healthy pregnancy (preconception care). During a preconception care visit, your health care provider will: °· Do a complete physical exam, including a Pap test. °· Take a complete medical history. °· Give you information, answer your questions, and help you resolve problems. °Preconception checklist °Medical history °· Tell your health care provider about any current or past medical conditions. Your pregnancy or your ability to become pregnant may be affected by chronic conditions, such as diabetes, chronic hypertension, and thyroid problems. °· Include your family's medical history as well as your partner's medical history. °· Tell your health care provider about any history of STIs (sexually transmitted infections). These can affect your pregnancy. In some cases, they can be passed to your baby. Discuss any concerns that you have about STIs. °· If indicated, discuss the benefits of genetic testing. This testing will show whether there are any genetic conditions that may be passed from you or your partner to your baby. °· Tell your health care provider about: °? Any problems you have had with conception or pregnancy. °? Any medicines you take. These include vitamins, herbal supplements, and over-the-counter medicines. °? Your history of immunizations. Discuss any vaccinations that you may need. °Diet °· Ask your health care provider what to include in a healthy diet that has a balance of nutrients. This is especially important when you are pregnant or preparing to become pregnant. °· Ask your health care provider to help you reach a healthy weight before pregnancy. °? If you are overweight, you may be at higher risk for certain complications, such as high blood pressure, diabetes, and preterm birth. °? If you are underweight, you are more likely to  have a baby who has a low birth weight. °Lifestyle, work, and home °· Let your health care provider know: °? About any lifestyle habits that you have, such as alcohol use, drug use, or smoking. °? About recreational activities that may put you at risk during pregnancy, such as downhill skiing and certain exercise programs. °? Tell your health care provider about any international travel, especially any travel to places with an active Zika virus outbreak. °? About harmful substances that you may be exposed to at work or at home. These include chemicals, pesticides, radiation, or even litter boxes. °? If you do not feel safe at home. °Mental health °· Tell your health care provider about: °? Any history of mental health conditions, including feelings of depression, sadness, or anxiety. °? Any medicines that you take for a mental health condition. These include herbs and supplements. °Home instructions to prepare for pregnancy °Lifestyle ° °· Eat a balanced diet. This includes fresh fruits and vegetables, whole grains, lean meats, low-fat dairy products, healthy fats, and foods that are high in fiber. Ask to meet with a nutritionist or registered dietitian for assistance with meal planning and goals. °· Get regular exercise. Try to be active for at least 30 minutes a day on most days of the week. Ask your health care provider which activities are safe during pregnancy. °· Do not use any products that contain nicotine or tobacco, such as cigarettes and e-cigarettes. If you need help quitting, ask your health care provider. °· Do not drink alcohol. °· Do not take illegal drugs. °· Maintain a healthy weight. Ask your health care provider what weight range is right for you. °General   instructions °· Keep an accurate record of your menstrual periods. This makes it easier for your health care provider to determine your baby's due date. °· Begin taking prenatal vitamins and folic acid supplements daily as directed by your  health care provider. °· Manage any chronic conditions, such as high blood pressure and diabetes, as told by your health care provider. This is important. °How do I know that I am pregnant? °You may be pregnant if you have been sexually active and you miss your period. Symptoms of early pregnancy include: °· Mild cramping. °· Very light vaginal bleeding (spotting). °· Feeling unusually tired. °· Nausea and vomiting (morning sickness). °If you have any of these symptoms and you suspect that you might be pregnant, you can take a home pregnancy test. These tests check for a hormone in your urine (human chorionic gonadotropin, or hCG). A woman's body begins to make this hormone during early pregnancy. These tests are very accurate. Wait until at least the first day after you miss your period to take one. If the test shows that you are pregnant (you get a positive result), call your health care provider to make an appointment for prenatal care. °What should I do if I become pregnant? ° °  ° °· Make an appointment with your health care provider as soon as you suspect you are pregnant. °· Do not use any products that contain nicotine, such as cigarettes, chewing tobacco, and e-cigarettes. If you need help quitting, ask your health care provider. °· Do not drink alcoholic beverages. Alcohol is related to a number of birth defects. °· Avoid toxic odors and chemicals. °· You may continue to have sexual intercourse if it does not cause pain or other problems, such as vaginal bleeding. °This information is not intended to replace advice given to you by your health care provider. Make sure you discuss any questions you have with your health care provider. °Document Revised: 01/25/2017 Document Reviewed: 08/15/2015 °Elsevier Patient Education © 2020 [employer] ° °

## 2020-02-04 LAB — RPR+HBSAG+HCVAB+...
HIV Screen 4th Generation wRfx: NONREACTIVE
Hep C Virus Ab: <0.1 s/co ratio (ref 0.0–0.9)
Hepatitis B Surface Ag: NEGATIVE
RPR Ser Ql: NONREACTIVE

## 2020-02-04 LAB — CYTOLOGY - PAP: Diagnosis: NEGATIVE

## 2020-02-04 LAB — CERVICOVAGINAL ANCILLARY ONLY
Chlamydia: NEGATIVE
Comment: NEGATIVE
Comment: NEGATIVE
Comment: NORMAL
Neisseria Gonorrhea: NEGATIVE
Trichomonas: NEGATIVE
